# Patient Record
Sex: Female | Born: 1990 | Race: Black or African American | Hispanic: No | Marital: Single | Smoking: Never smoker
Health system: Southern US, Community
[De-identification: ages and names within clinical notes are randomized; demographics above are authoritative.]

## PROBLEM LIST (undated history)

## (undated) ENCOUNTER — Inpatient Hospital Stay (HOSPITAL_COMMUNITY): Payer: Self-pay

## (undated) DIAGNOSIS — D649 Anemia, unspecified: Secondary | ICD-10-CM

## (undated) DIAGNOSIS — K649 Unspecified hemorrhoids: Secondary | ICD-10-CM

## (undated) DIAGNOSIS — R51 Headache: Secondary | ICD-10-CM

## (undated) HISTORY — PX: INDUCED ABORTION: SHX677

---
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
## 2009-01-22 ENCOUNTER — Ambulatory Visit: Payer: Self-pay | Admitting: Family Medicine

## 2009-06-21 ENCOUNTER — Ambulatory Visit: Payer: Self-pay | Admitting: Family Medicine

## 2009-06-21 DIAGNOSIS — R209 Unspecified disturbances of skin sensation: Secondary | ICD-10-CM | POA: Insufficient documentation

## 2009-12-31 ENCOUNTER — Ambulatory Visit: Payer: Self-pay

## 2010-01-12 NOTE — L&D Delivery Note (Signed)
**Note Abigail-Identified via Obfuscation** Delivery Note At 11:39 PM a viable female was delivered via Vaginal, Spontaneous Delivery (Presentation: ROA ).  APGAR: 9,9 ; weight 6lb9oz  Placenta status: delivered, intact, 3VC.  Anesthesia: Epidural  Lacerations: none Est. Blood Loss (mL): < 250 cc  Mom to postpartum.  Baby to nursery-stable.  Abigail Phelps,Abigail Phelps 10/20/2010, 11:59 PM  The patient was seen with Dr Tye Savoy.  I agree with the above. Abigail Phelps 10/21/2010 1:58 AM

## 2010-01-21 ENCOUNTER — Ambulatory Visit: Admit: 2010-01-21 | Payer: Self-pay

## 2010-02-11 NOTE — Assessment & Plan Note (Signed)
Summary: labial pain, depo shot,df   Vital Signs:  Patient profile:   20 year old female Weight:      139.9 pounds Temp:     98.6 degrees F oral Pulse rate:   83 / minute Pulse rhythm:   regular BP sitting:   108 / 74  (left arm) Cuff size:   regular  Vitals Entered By: Loralee Pacas CMA (June 21, 2009 2:02 PM) CC: abdominal pain   Primary Care Provider:  Delbert Harness MD  CC:  abdominal pain.  History of Present Illness: 20 year old here for vaginal discomfort  Noticed about a month ago having some vaginal discomfort in vagina where she thinks her stitches from delivery were.  Did not have pain postpartum- delivered in almost 6 months ago.  No dyspareunia,  but certain undergarments rubs it.  No vaginal bleeding or discharge.  Only bothers her when she wears  boy shorts.  contraception: Wants to stay with depo provera.  Current Medications (verified): 1)  Prenatal Ad  Tabs (Prenatal Vit-Dss-Fe Cbn-Fa) .Marland Kitchen.. 1 Tab By Mouth Daily. 2)  Iron 325 (65 Fe) Mg Tabs (Ferrous Sulfate) .Marland Kitchen.. 1 By Mouth Two Times A Day  Allergies: No Known Drug Allergies PMH-FH-SH reviewed-no changes except otherwise noted  Review of Systems GU:  Denies abnormal vaginal bleeding, discharge, dysuria, and genital sores.  Physical Exam  General:  NAD, Vital signs noted.  Here with friend, cheerful appearing, bonding well with baby.    Genitalia:  normal external genitalia without lesion.  Patient points to area in fold of right labia minora as painful.  thin line of hypopigmentatin possibly an old well healed laceration 2 mm in length.     Impression & Recommendations:  Problem # 1:  DISTURBANCE OF SKIN SENSATION (ICD-782.0)  discomfort of labia not explained by physical exam.  Not likely due to delivery as pain started months after delivery.  No hx of HSV or evidence.  Given no dyspareunia and not distressing to patient will monitor and reasses if continues to bother.  Orders: [facility]- Est Level  3  ([address]109)  Problem # 2:  CONTRACEPTIVE MANAGEMENT (ICD-V25.09) desires to continue with depo provera.  U preg neg, shot given today. Orders: U Preg-[facility] (81025) [facility]- Est Level  3 (60454)  Complete Medication List: 1)  Prenatal Ad Tabs (Prenatal vit-dss-fe cbn-fa) .Marland Kitchen.. 1 tab by mouth daily.  Patient Instructions: 1)  Get depo shot every 3 months! 2)  Follow up in 1 year from postpartum for annual gynecological exam. (January 2012)  Laboratory Results   Urine Tests  Date/Time Received: June 21, 2009 2:13 PM  Date/Time Reported: June 21, 2009 2:21 PM     Urine HCG: negative Comments: ...............test performed by......Marland KitchenBonnie A. [place], MLS (ASCP)cm

## 2010-02-11 NOTE — Assessment & Plan Note (Signed)
Summary: postpartum ck,tcb   Vital Signs:  Patient profile:   20 year old female Weight:      133.5 pounds Temp:     98.3 degrees F oral Pulse rate:   60 / minute Pulse rhythm:   regular BP sitting:   104 / 67  (right arm) Cuff size:   regular  Vitals Entered By: Loralee Pacas CMA (January 22, 2009 2:24 PM) CC: post partum check   Primary Care Provider:  Delbert Harness MD  CC:  post partum check.  History of Present Illness: 20 yo 4 weeks postpartum from SVD on Dec 6.  No complications.  Baby went home with mom. Continued period- menstrual-like.  No pain.  Here today with mom. adjusting well, bonding with baby.  Depo for contraception.  Both bottle and breastfeeding.  No problems with breast tenderness.  Taking multivitamins.  No intercourse yet.  Allergies: No Known Drug Allergies  Review of Systems      See HPI General:  Denies fever. GU:  Denies abnormal vaginal bleeding, discharge, dysuria, and incontinence. Psych:  Denies anxiety and depression.  Physical Exam  General:  NAD, Vital signs noted.  Here with mom, cheerful appearing, bonding well.  Genitalia:  Pelvic Exam:        External: normal female genitalia without lesions or masses        Vagina: normal well healed with no stitches left.        Cervix: normal without tears        Adnexa: normal bimanual exam without masses or fullness        Uterus: normal by palpation    Impression & Recommendations:  Problem # 1:  POSTPARTUM EXAMINATION, NORMAL (ICD-V24.2)  No signs of postpartum depression.  Decerasing lochia, nromal exam with no evidence of endometritis, vaginal or cervical laceration.  Depo for contraception.  Follow-up as needed.  [facility]  Complete Medication List: 1)  Prenatal Ad Tabs (Prenatal vit-dss-fe cbn-fa) .Marland Kitchen.. 1 tab by mouth daily. 2)  Iron 325 (65 Fe) Mg Tabs (Ferrous sulfate) .Marland Kitchen.. 1 by mouth two times a day

## 2010-02-24 ENCOUNTER — Encounter: Payer: Self-pay | Admitting: *Deleted

## 2010-03-19 ENCOUNTER — Ambulatory Visit (INDEPENDENT_AMBULATORY_CARE_PROVIDER_SITE_OTHER): Payer: Self-pay | Admitting: Family Medicine

## 2010-03-19 ENCOUNTER — Encounter: Payer: Self-pay | Admitting: Family Medicine

## 2010-03-19 VITALS — BP 98/76 | HR 88 | Temp 98.6°F | Ht 65.0 in | Wt 142.0 lb

## 2010-03-19 DIAGNOSIS — Z3201 Encounter for pregnancy test, result positive: Secondary | ICD-10-CM | POA: Insufficient documentation

## 2010-03-19 DIAGNOSIS — Z3480 Encounter for supervision of other normal pregnancy, unspecified trimester: Secondary | ICD-10-CM

## 2010-03-19 DIAGNOSIS — N912 Amenorrhea, unspecified: Secondary | ICD-10-CM | POA: Insufficient documentation

## 2010-03-19 LAB — POCT URINE PREGNANCY: Preg Test, Ur: POSITIVE

## 2010-03-19 MED ORDER — PRENATAL AD PO TABS: 1.0000 | ORAL_TABLET | Freq: Every day | ORAL | Status: DC

## 2010-03-19 NOTE — Progress Notes (Signed)
  Subjective:    Patient ID: Abigail Phelps, female    DOB: 08-18-1990, 20 y.o.   MRN: [id number]  HPI  LMP Norberta Keens 2011.  Last depo shot was June 2011.  Took a pregnancy test and it was positive.  Has been feeling nauseous, breast tenderness, no irritability, emesis, weight gain or edema.  Was unplanned but happy to have another child with the father of her other child.    Review of Systemssee hpi     Objective:   Physical Exam  Constitutional: She appears well-developed and well-nourished. No distress.  Abdominal: Soft. Bowel sounds are normal. She exhibits no distension.       Small abdomen, consistent with 1st trimester pregnancy          Assessment & Plan:

## 2010-03-19 NOTE — Patient Instructions (Signed)
Congratulations! Take a prenatal vitamin every day- any one available over the counter is fine.  What is most important is getting 400-800 mg of folic acid daily. Will schedule you for OB ultrasound. We will help you schedule NEW OB appt.

## 2010-03-19 NOTE — Assessment & Plan Note (Signed)
Unsure dates, will get ultrasound for dating.  Will flag to Abigail Phelps when [facility] known to establish pregnancy care here at [facility].  Given prescription for PNV.  Not taking any harmful substances.

## 2010-03-20 ENCOUNTER — Other Ambulatory Visit: Payer: Self-pay | Admitting: Family Medicine

## 2010-03-20 ENCOUNTER — Telehealth: Payer: Self-pay | Admitting: Family Medicine

## 2010-03-20 ENCOUNTER — Ambulatory Visit (HOSPITAL_COMMUNITY)
Admission: RE | Admit: 2010-03-20 | Discharge: 2010-03-20 | Disposition: A | Discharge status: Home / Self Care | Payer: Medicaid Other | Source: Ambulatory Visit | Attending: Family Medicine | Admitting: Family Medicine

## 2010-03-20 DIAGNOSIS — Z3689 Encounter for other specified antenatal screening: Secondary | ICD-10-CM | POA: Insufficient documentation

## 2010-03-20 DIAGNOSIS — Z3480 Encounter for supervision of other normal pregnancy, unspecified trimester: Secondary | ICD-10-CM

## 2010-03-20 DIAGNOSIS — Z3201 Encounter for pregnancy test, result positive: Secondary | ICD-10-CM

## 2010-03-20 MED ORDER — PRENATAL PLUS 27-1 MG PO TABS: 1.0000 | ORAL_TABLET | Freq: Every day | ORAL | Status: DC

## 2010-03-20 NOTE — Telephone Encounter (Signed)
[facility] does not carry rx prescribed for prenatal vitamins, they only carry prenatal plus, can they switch?

## 2010-03-20 NOTE — Telephone Encounter (Signed)
i have e-prescribed it

## 2010-04-02 ENCOUNTER — Other Ambulatory Visit: Payer: Self-pay

## 2010-04-02 ENCOUNTER — Encounter: Payer: Self-pay | Admitting: Family Medicine

## 2010-04-02 DIAGNOSIS — Z3480 Encounter for supervision of other normal pregnancy, unspecified trimester: Secondary | ICD-10-CM

## 2010-04-02 DIAGNOSIS — R8271 Bacteriuria: Secondary | ICD-10-CM

## 2010-04-02 NOTE — Progress Notes (Signed)
Drew pt for New OB labs. Prenatal Panel, HIV, Sickle Cell, and OB urine culture. 1sst, 1red, 1lavender, 1pink, and 1urine. AC 

## 2010-04-03 ENCOUNTER — Emergency Department (HOSPITAL_COMMUNITY)
Admission: EM | Admit: 2010-04-03 | Discharge: 2010-04-04 | Disposition: A | Discharge status: Home / Self Care | Payer: Medicaid Other | Attending: Emergency Medicine | Admitting: Emergency Medicine

## 2010-04-03 DIAGNOSIS — R112 Nausea with vomiting, unspecified: Secondary | ICD-10-CM | POA: Insufficient documentation

## 2010-04-03 DIAGNOSIS — R197 Diarrhea, unspecified: Secondary | ICD-10-CM | POA: Insufficient documentation

## 2010-04-03 DIAGNOSIS — O9989 Other specified diseases and conditions complicating pregnancy, childbirth and the puerperium: Secondary | ICD-10-CM | POA: Insufficient documentation

## 2010-04-03 LAB — URINALYSIS, ROUTINE W REFLEX MICROSCOPIC
Glucose, UA: NEGATIVE mg/dL
Hgb urine dipstick: NEGATIVE
Ketones, ur: >80 mg/dL — AB
pH: 6 (ref 5.0–8.0)

## 2010-04-03 LAB — URINE MICROSCOPIC-ADD ON

## 2010-04-04 LAB — POCT I-STAT, CHEM 8
Calcium, Ion: 1.21 mmol/L (ref 1.12–1.32)
Chloride: 104 mEq/L (ref 96–112)
HCT: 38 % (ref 36.0–46.0)
Sodium: 135 mEq/L (ref 135–145)

## 2010-04-04 LAB — OBSTETRIC PANEL
Basophils Absolute: 0 10*3/uL (ref 0.0–0.1)
Basophils Relative: 0 % (ref 0–1)
Eosinophils Absolute: 0 10*3/uL (ref 0.0–0.7)
HCT: 33.5 % — ABNORMAL LOW (ref 36.0–46.0)
Lymphs Abs: 1.2 10*3/uL (ref 0.7–4.0)
MCH: 23.1 pg — ABNORMAL LOW (ref 26.0–34.0)
Monocytes Relative: 6 % (ref 3–12)
Neutro Abs: 3.1 10*3/uL (ref 1.7–7.7)
RDW: 16.3 % — ABNORMAL HIGH (ref 11.5–15.5)
Rubella: 130.1 IU/mL — ABNORMAL HIGH
WBC: 4.6 10*3/uL (ref 4.0–10.5)

## 2010-04-04 MED ORDER — NITROFURANTOIN MONOHYD MACRO 100 MG PO CAPS: 100.0000 mg | ORAL_CAPSULE | Freq: Two times a day (BID) | ORAL | Status: AC

## 2010-04-04 NOTE — Progress Notes (Signed)
Addended by: Delbert Harness on: 04/04/2010 04:17 PM   Modules accepted: Orders

## 2010-04-04 NOTE — Progress Notes (Signed)
Discussed with patient on phone treating asymptomatic bactiuria.  She is having nausea/vomtingof pregnancy, has some zofran from ER.  Advised may follow-up in office next week if continuing to have problems.  Has NEW OB appts scgeduled Mar 3rd.

## 2010-04-06 LAB — URINE CULTURE: Colony Count: >=100000

## 2010-04-15 ENCOUNTER — Other Ambulatory Visit (HOSPITAL_COMMUNITY)
Admission: RE | Admit: 2010-04-15 | Discharge: 2010-04-15 | Disposition: A | Discharge status: Home / Self Care | Payer: Self-pay | Source: Ambulatory Visit | Attending: Family Medicine | Admitting: Family Medicine

## 2010-04-15 ENCOUNTER — Ambulatory Visit (INDEPENDENT_AMBULATORY_CARE_PROVIDER_SITE_OTHER): Payer: Self-pay | Admitting: Family Medicine

## 2010-04-15 ENCOUNTER — Encounter: Payer: Self-pay | Admitting: Family Medicine

## 2010-04-15 VITALS — BP 105/77 | Temp 98.0°F | Wt 133.0 lb

## 2010-04-15 DIAGNOSIS — Z331 Pregnant state, incidental: Secondary | ICD-10-CM

## 2010-04-15 DIAGNOSIS — Z01419 Encounter for gynecological examination (general) (routine) without abnormal findings: Secondary | ICD-10-CM | POA: Insufficient documentation

## 2010-04-15 DIAGNOSIS — Z3201 Encounter for pregnancy test, result positive: Secondary | ICD-10-CM

## 2010-04-15 LAB — CBC
HCT: 25.5 % — ABNORMAL LOW (ref 36.0–46.0)
HCT: 37.9 % (ref 36.0–46.0)
Hemoglobin: 8.1 g/dL — ABNORMAL LOW (ref 12.0–15.0)
MCHC: 31.8 g/dL (ref 30.0–36.0)
MCHC: 32.2 g/dL (ref 30.0–36.0)
MCV: 79.2 fL (ref 78.0–100.0)
Platelets: 233 10*3/uL (ref 150–400)
RBC: 4.79 MIL/uL (ref 3.87–5.11)
RDW: 15.2 % (ref 11.5–15.5)
WBC: 9.3 10*3/uL (ref 4.0–10.5)

## 2010-04-15 MED ORDER — CEPHALEXIN 500 MG PO CAPS: 500.0000 mg | ORAL_CAPSULE | Freq: Two times a day (BID) | ORAL | Status: AC

## 2010-04-15 NOTE — Patient Instructions (Addendum)
It was great to meet you today.  Congratulations! For nausea/vomiting during your first trimester, please pick up OTC Vitamin B6 25mg .  Take as directed. For your UTI, please take Keflex (antibiotic) as directed. We will call you with a date and time of your integrated screen. Please schedule a follow-up appointment with me in 1 month. Please call MD if you have any questions or concerns.  If you experience vaginal bleeding or excessive discharge, please go to [facility] MAU. Thanks, Dr. Sherron Flemings Sondra Come

## 2010-04-22 ENCOUNTER — Encounter: Payer: Self-pay | Admitting: Family Medicine

## 2010-04-22 DIAGNOSIS — Z3490 Encounter for supervision of normal pregnancy, unspecified, unspecified trimester: Secondary | ICD-10-CM | POA: Insufficient documentation

## 2010-04-22 NOTE — Assessment & Plan Note (Signed)
[id number] here a 12w 1/7d here for initial OB visit. Has experienced nausea and vomiting in first trimester, but cannot afford Zofran.  Still waiting for Medicaid approval. Recommended OTC Vit B6 to prevent N/V.  Denies other first trimester complications. Will refer patient to [facility] for integrated screen.  PTL precautions discussed.  Will treat UTI with Keflex.  No early glucola indicated, taking PNV. F/u in 4 weeks. PTL precautions discussed.

## 2010-05-15 ENCOUNTER — Ambulatory Visit (INDEPENDENT_AMBULATORY_CARE_PROVIDER_SITE_OTHER): Payer: Medicaid Other | Admitting: Family Medicine

## 2010-05-15 DIAGNOSIS — Z3480 Encounter for supervision of other normal pregnancy, unspecified trimester: Secondary | ICD-10-CM

## 2010-05-15 NOTE — Patient Instructions (Signed)
It was great to see you today. I will order an anatomy U/S and integrated screen. We will call you with a time and date.  If you don't hear from [place] in 2 weeks, please call the office. You and baby are doing great.  Keep up the good work. Please schedule a follow up appointment at the [facility] clinic with Dr. [place] or Dr. Mauricio Po in 1 month. Thanks!

## 2010-05-20 ENCOUNTER — Telehealth: Payer: Self-pay | Admitting: Family Medicine

## 2010-05-20 NOTE — Telephone Encounter (Signed)
Never got rx for iron pills [facility] [address]

## 2010-05-20 NOTE — Telephone Encounter (Signed)
Hi Abigail Phelps, she is already taking a prenatal vitamin with Fe so she does not need to anything else.  Thanks.

## 2010-05-21 NOTE — Telephone Encounter (Signed)
Spoke with patient and informed about the below

## 2010-05-22 ENCOUNTER — Encounter: Payer: Self-pay | Admitting: Family Medicine

## 2010-05-22 NOTE — Assessment & Plan Note (Addendum)
[id number] here @ 16w 3/7d here for routine prenatal OB visit.   Denies any complaints of N/V that she experienced in first trimester.   Treated UTI w/ Keflex at last visit.  Will need follow up TOC at later visit.  No early glucola indicated, no 1st degree relative with DM or hx of GDM in previous pregnancies.   Will need to ask patient what year she had the miscarriage.   Taking PNV.  PTL precautions discussed. F/u in 4 weeks.

## 2010-05-30 ENCOUNTER — Telehealth: Payer: Self-pay | Admitting: Family Medicine

## 2010-05-30 NOTE — Telephone Encounter (Signed)
Pt checking status of appt for u/s

## 2010-06-02 ENCOUNTER — Other Ambulatory Visit: Payer: Self-pay | Admitting: Family Medicine

## 2010-06-02 DIAGNOSIS — Z3689 Encounter for other specified antenatal screening: Secondary | ICD-10-CM

## 2010-06-02 NOTE — Telephone Encounter (Signed)
Pt has an appt to have US done on 05.23.2012 @ 3 pm pt to arrive @ 240 pm. Pt informed and agreed.Abigail Phelps

## 2010-06-04 ENCOUNTER — Ambulatory Visit (HOSPITAL_COMMUNITY)
Admission: RE | Admit: 2010-06-04 | Discharge: 2010-06-04 | Disposition: A | Discharge status: Home / Self Care | Payer: Medicaid Other | Source: Ambulatory Visit | Attending: Family Medicine | Admitting: Family Medicine

## 2010-06-04 DIAGNOSIS — Z3689 Encounter for other specified antenatal screening: Secondary | ICD-10-CM | POA: Insufficient documentation

## 2010-06-04 LAB — US OB COMP + 14 WK

## 2010-06-19 ENCOUNTER — Ambulatory Visit (INDEPENDENT_AMBULATORY_CARE_PROVIDER_SITE_OTHER): Payer: Self-pay | Admitting: Family Medicine

## 2010-06-19 ENCOUNTER — Encounter: Payer: Self-pay | Admitting: Family Medicine

## 2010-06-19 DIAGNOSIS — Z3480 Encounter for supervision of other normal pregnancy, unspecified trimester: Secondary | ICD-10-CM

## 2010-06-19 NOTE — Progress Notes (Signed)
Pt seen with Dr. Lula Olszewski today. Pt too late for a quad screen.   Advised supportive care for the ganglion cyst. Urine cx today

## 2010-06-19 NOTE — Patient Instructions (Signed)
It was nice to meet you.  Please make an appointment to be seen again in 4 weeks.  If you have any problems with contractions, vaginal bleeding, decreased movement of your baby, or you think your water has broken, please go to [facility] to be evaluated.  If you are interested in birthing classes, parenting classes, or breastfeeding classes, please let Dr. Tye Savoy know or you can call [facility].

## 2010-06-19 NOTE — Progress Notes (Signed)
   Abigail Phelps is a 20 y.o. female being seen today for her obstetrical visit. She is at [redacted]w[redacted]d gestation. Patient reports pain in her right wrist, especially with extension.  She works at [employer] and it bothers her a lot there.  She also has a bump on her wrist where it hurts the worst. . Fetal movement: normal.  Wrist pain- patient likely has ganglion cyst on 3rd right extensor tendon.  Advise Tylenol and icing the wrist.  Quad screen [place] reviewed,no abnormalities seen.  Will do Urine Cx today for test of cure.  Follow up in 4 weeks.

## 2010-06-21 LAB — URINE CULTURE: Colony Count: NO GROWTH

## 2010-07-14 ENCOUNTER — Ambulatory Visit (INDEPENDENT_AMBULATORY_CARE_PROVIDER_SITE_OTHER): Payer: Medicaid Other | Admitting: Family Medicine

## 2010-07-14 VITALS — BP 101/62 | Temp 98.6°F | Wt 145.0 lb

## 2010-07-14 DIAGNOSIS — Z3480 Encounter for supervision of other normal pregnancy, unspecified trimester: Secondary | ICD-10-CM

## 2010-07-14 DIAGNOSIS — Z3490 Encounter for supervision of normal pregnancy, unspecified, unspecified trimester: Secondary | ICD-10-CM

## 2010-07-14 DIAGNOSIS — Z331 Pregnant state, incidental: Secondary | ICD-10-CM

## 2010-07-14 NOTE — Progress Notes (Signed)
[redacted]w[redacted]d gestation.  No HA, SOB, N/V, vaginal bleeding.  Good fetal activity. Concerned about umbilical hernia that is becoming more prominent as her belly grows. Will follow for now.  This should not interfere with pregnancy.  No pain or tenderness on PE. Will do a 1 hour GTT today. Will order CBC/RPR/HIV. Patient to fill out PMH form. Return to clinic in 4 weeks.  Will send hollister to MAU at that time via Epic.

## 2010-07-14 NOTE — Assessment & Plan Note (Signed)
[id number] here @ 24w 5/7d here for routine prenatal OB visit.   Concerned about umbilical hernia.  Reassured patient.  Will monitor. Treated UTI w/ Keflex during first trimester.  TOC urine culture: no growth. 1 hr GTT was 82.  Taking PNV.  PTL precautions discussed. F/u in 4 weeks.

## 2010-07-14 NOTE — Patient Instructions (Signed)
It was great to see you today. You and your baby are doing well. Keep up the good work! Continue to take prenatal vitamin daily. Practice kick counts - baby should move/kick 10 times in an hour.  -- if you feel like baby is not moving well, please to go MAU.  -- if you experience vaginal bleeding, please go to MAU. Please return to clinic in 4 weeks for follow up. Thanks!

## 2010-07-15 LAB — CBC
HCT: 29 % — ABNORMAL LOW (ref 36.0–46.0)
MCH: 23 pg — ABNORMAL LOW (ref 26.0–34.0)
MCHC: 31.7 g/dL (ref 30.0–36.0)
MCV: 72.5 fL — ABNORMAL LOW (ref 78.0–100.0)
RDW: 14.1 % (ref 11.5–15.5)

## 2010-08-08 ENCOUNTER — Telehealth: Payer: Self-pay | Admitting: Family Medicine

## 2010-08-08 NOTE — Telephone Encounter (Signed)
Pt is needing to talk to nurse about what she can use for hemorrhoids.

## 2010-08-08 NOTE — Telephone Encounter (Signed)
States hemorrhoid was swollen and painful. She has been using Tucks and swelling has gone down. Now just having itching and irritation and little bleeding. Consulted with Dr. Mauricio Po.  Advises Sitz bath with Epsom Salt, Preparation H  may also continue using Tucks.

## 2010-08-12 ENCOUNTER — Ambulatory Visit: Payer: Medicaid Other | Admitting: Family Medicine

## 2010-08-19 ENCOUNTER — Ambulatory Visit (INDEPENDENT_AMBULATORY_CARE_PROVIDER_SITE_OTHER): Payer: Medicaid Other | Admitting: Family Medicine

## 2010-08-19 DIAGNOSIS — Z3480 Encounter for supervision of other normal pregnancy, unspecified trimester: Secondary | ICD-10-CM

## 2010-08-19 NOTE — Progress Notes (Signed)
29.6 weeks today.  Patient has gained 13 lbs since pre-pregnancy weight. Fundus measured 26 cm today.  FHR reassuring. Will send patient to [facility] for OB ultrasound re: question IUGR. Repeat 1 hr GTT was 120. Reviewed kick counts and PTL precautions. Plans to breast and bottle feed.  Contraception: undecided Follow up in 4 weeks.  Follow up ultrasound.

## 2010-08-19 NOTE — Patient Instructions (Signed)
Please go to [facility] for a repeat OB ultrasound to makes sure baby is growing appropriately. You may take OTC Vitamin B6 for nausea. Please schedule follow up appointment with me in 4 weeks. Thank you!

## 2010-08-21 ENCOUNTER — Other Ambulatory Visit: Payer: Self-pay | Admitting: Family Medicine

## 2010-08-21 ENCOUNTER — Ambulatory Visit (HOSPITAL_COMMUNITY)
Admission: RE | Admit: 2010-08-21 | Discharge: 2010-08-21 | Disposition: A | Discharge status: Home / Self Care | Payer: Medicaid Other | Source: Ambulatory Visit | Attending: Family Medicine | Admitting: Family Medicine

## 2010-08-21 DIAGNOSIS — O365990 Maternal care for other known or suspected poor fetal growth, unspecified trimester, not applicable or unspecified: Secondary | ICD-10-CM | POA: Insufficient documentation

## 2010-08-21 DIAGNOSIS — Z3689 Encounter for other specified antenatal screening: Secondary | ICD-10-CM | POA: Insufficient documentation

## 2010-09-03 ENCOUNTER — Ambulatory Visit (INDEPENDENT_AMBULATORY_CARE_PROVIDER_SITE_OTHER): Payer: Medicaid Other | Admitting: Family Medicine

## 2010-09-03 DIAGNOSIS — Z3480 Encounter for supervision of other normal pregnancy, unspecified trimester: Secondary | ICD-10-CM

## 2010-09-03 NOTE — Progress Notes (Signed)
32 weeks today.  Has gained 3 lbs in 4 weeks. Reviewed ultrasound with patient - normal.  No evidence of IUGR. Fundus measured 30cm.  Reassuring FHR. Reviewed kick counts and PTL precautions.  Plans to breast and bottle feed. Contraception: Depo Follow up in 2 weeks.

## 2010-09-03 NOTE — Patient Instructions (Signed)
Please schedule follow-up appointment in 2 weeks

## 2010-09-18 ENCOUNTER — Ambulatory Visit (INDEPENDENT_AMBULATORY_CARE_PROVIDER_SITE_OTHER): Payer: Medicaid Other | Admitting: Family Medicine

## 2010-09-18 DIAGNOSIS — Z3480 Encounter for supervision of other normal pregnancy, unspecified trimester: Secondary | ICD-10-CM

## 2010-09-18 NOTE — Patient Instructions (Signed)
Please return to clinic in 2 weeks. If your contractions become regular, every 5 to 7 minutes, please go to MAU. Count kicks or fetal movement by laying down for an hour and counting each kick - if less than 10 kicks in an hour, go to MAU. If you develop vaginal bleeding or gush of fluid, please to go MAU.

## 2010-09-18 NOTE — Progress Notes (Signed)
[redacted]w[redacted]d today.  Complains of Braxton Hicks contractions; painful, but last for less than a minute. Fundus measured 32cm.  Reassuring FHR. PTL precautions - patient concerned that contractions are too close together.  Advised patient to go to MAU if contractions occur every regularly, every 5 - 7 minutes. Plans to breast and bottle feed. Contraception: Depo Follow up in 2 weeks.

## 2010-10-02 ENCOUNTER — Ambulatory Visit (INDEPENDENT_AMBULATORY_CARE_PROVIDER_SITE_OTHER): Payer: Medicaid Other | Admitting: Family Medicine

## 2010-10-02 DIAGNOSIS — Z23 Encounter for immunization: Secondary | ICD-10-CM

## 2010-10-02 DIAGNOSIS — Z331 Pregnant state, incidental: Secondary | ICD-10-CM

## 2010-10-02 NOTE — Patient Instructions (Signed)
I will review your lab results at your next visit. Please schedule follow up appointment next week. Normal Labor and Delivery (First Baby) Your caregiver must first be sure you are in labor. Signs of labor include:  You may pass what is called "the mucus plug" before labor begins. This is a small amount of blood stained mucus.   Regular uterine contractions.   The time between contractions get closer together.   The discomfort and pain gradually gets more intense.   Pains are mostly located in the back.   Pains get worse when walking.   The cervix (the opening of the uterus becomes thinner (begins to efface) and opens up (dilates).  Once you are in labor and admitted into the hospital or care center, your caregiver will do the following:  A complete physical examination.   Check your vital signs (blood pressure, pulse, temperature and the fetal heart rate).   Do a vaginal examination (using a sterile glove and lubricant) to determine:   The position (presentation) of the baby (head [vertex] or buttock first).   The level (station) of the baby's head in the birth canal.   The effacement and dilatation of the cervix.   You may have your pubic hair shaved and be given an enema depending on your caregiver and the circumstance.   An electronic monitor is usually placed on your abdomen. The monitor follows the length and intensity of the contractions, as well as the baby's heart rate.   Usually, your caregiver will insert an IV in your arm with a bottle of sugar water. This is done as a precaution so that medications can be given to you quickly during labor or delivery.  NORMAL LABOR AND DELIVERY IS DIVIDED UP INTO THREE STAGES: First Stage This is when regular contractions begin and the cervix begins to efface and dilate. This stage can last from 3 to 15 hours. The end of the first stage is when the cervix is 100% effaced and 10 centimeters dilated. Pain medications may be given by    Injection (morphine, demerol, etc.)   Regional anesthesia (spinal, caudal or epidural, anesthetics given in different locations of the spine). Paracervical pain medication may be given, which is an injection of and anesthetic (novocaine or xylocaine) on each side of the cervix.  A pregnant woman may request to have "Natural Childbirth" which is not to have any medications or anesthesia during her labor and delivery. Second Stage This is when the baby comes down through the birth canal (vagina) and is born. This can take 1 to 4 hours. As the baby's head comes down through the birth canal, you may feel like you are going to have a bowel movement. You will get the urge to bear down and push until the baby is delivered. As the baby's head is being delivered, the caregiver will decide if an episiotomy (a cut in the perineum and vagina area) is needed to prevent tearing of the tissue in this area. The episiotomy is sewn up after the delivery of the baby and placenta. Sometimes a mask with nitrous oxide is given for the mother to breath during the delivery of the baby to help if there is too much pain. The end of Stage 2 is when the baby is fully delivered. Then when the umbilical cord stops pulsating it is clamped and cut. Third Stage The third stage begins after the baby is completely delivered and ends after the placenta (afterbirth) is delivered. This usually  takes 5 to 30 minutes. After the placenta is delivered, a medication is given either by intravenous or injection to help contract the uterus and prevent bleeding. The third stage is not painful and pain medication is usually not necessary. If an episiotomy was done, it is repaired at this time. After the delivery, the mother is watched and monitored closely for 1 to 2 hours to make sure there is no postpartum bleeding (hemorrhage). If there is a lot of bleeding, medication is given to contract the uterus and stop the bleeding. Document Released:  10/08/2007 Document Re-Released: 01/20/2009 [facility] Patient Information 2011 [place], [place].

## 2010-10-02 NOTE — Progress Notes (Signed)
36.1 weeks today. Irregular contractions - braxton hicks, but more painful than before. Will obtain GC/Chlamydia/GBS cultures today. Birth control: Depo shot Plans to Breast and bottle feed. Labor precautions reviewed. Follow up visit in one week.

## 2010-10-07 ENCOUNTER — Ambulatory Visit (INDEPENDENT_AMBULATORY_CARE_PROVIDER_SITE_OTHER): Payer: Medicaid Other | Admitting: Family Medicine

## 2010-10-07 DIAGNOSIS — Z331 Pregnant state, incidental: Secondary | ICD-10-CM

## 2010-10-07 NOTE — Patient Instructions (Signed)
Labor precautions and kick counts reviewed. Return to clinic in one week.

## 2010-10-07 NOTE — Progress Notes (Signed)
36.6 weeks - no complaints; irregular, painful contractions. GBS negative; Gonorrhea/Chlamydia negative. Birth control: Depo Plans to breast and bottle feed. Labor precautions reviewed. Return to clinic in one week.

## 2010-10-14 ENCOUNTER — Ambulatory Visit (INDEPENDENT_AMBULATORY_CARE_PROVIDER_SITE_OTHER): Payer: Medicaid Other | Admitting: Family Medicine

## 2010-10-14 DIAGNOSIS — Z331 Pregnant state, incidental: Secondary | ICD-10-CM

## 2010-10-14 LAB — POCT FERN TEST: Fern Test: NEGATIVE

## 2010-10-15 LAB — SAMPLE TO BLOOD BANK

## 2010-10-15 LAB — CBC
HCT: 30.9 — ABNORMAL LOW
HCT: 31.6 — ABNORMAL LOW
Hemoglobin: 10.3 — ABNORMAL LOW
Platelets: 237
RBC: 4.15
RDW: 14.8
RDW: 14.9
WBC: 9.2

## 2010-10-15 LAB — WET PREP, GENITAL
Clue Cells Wet Prep HPF POC: NONE SEEN
Trich, Wet Prep: NONE SEEN
Yeast Wet Prep HPF POC: NONE SEEN

## 2010-10-15 LAB — ABO/RH: ABO/RH(D): O POS

## 2010-10-15 LAB — GC/CHLAMYDIA PROBE AMP, GENITAL: Chlamydia, DNA Probe: NEGATIVE

## 2010-10-15 NOTE — Patient Instructions (Signed)
Labor precautions reviewed. Please schedule follow up appointment in 1 week.

## 2010-10-15 NOTE — Progress Notes (Signed)
37.6 weeks - complains of increase in vaginal discharge, thick mucous.   Also c/o intermittent pelvic and low back pain. Will do fern and nitrazine test today to evaluate for ROM. Reassured patient that pelvic pain is common at this time, but if becomes unbearable >> MAU. GBS negative. Birth control: Depo Plans to breast and bottle feed. If water breaks, bleeding, or regular ctx >> MAU. Otherwise, return to clinic in one week.

## 2010-10-17 ENCOUNTER — Observation Stay (HOSPITAL_COMMUNITY)
Admission: AD | Admit: 2010-10-17 | Discharge: 2010-10-18 | DRG: 780 | Disposition: A | Discharge status: Home / Self Care | Payer: Medicaid Other | Source: Ambulatory Visit | Attending: Obstetrics and Gynecology | Admitting: Obstetrics and Gynecology

## 2010-10-17 ENCOUNTER — Encounter (HOSPITAL_COMMUNITY): Payer: Self-pay | Admitting: *Deleted

## 2010-10-17 DIAGNOSIS — O479 False labor, unspecified: Principal | ICD-10-CM | POA: Diagnosis present

## 2010-10-17 DIAGNOSIS — O471 False labor at or after 37 completed weeks of gestation: Secondary | ICD-10-CM | POA: Diagnosis present

## 2010-10-17 DIAGNOSIS — M79609 Pain in unspecified limb: Secondary | ICD-10-CM | POA: Diagnosis present

## 2010-10-17 DIAGNOSIS — IMO0001 Reserved for inherently not codable concepts without codable children: Secondary | ICD-10-CM

## 2010-10-17 DIAGNOSIS — R109 Unspecified abdominal pain: Secondary | ICD-10-CM | POA: Diagnosis present

## 2010-10-17 HISTORY — DX: Headache: R51

## 2010-10-17 LAB — CBC
HCT: 27.1 % — ABNORMAL LOW (ref 36.0–46.0)
Hemoglobin: 8.7 g/dL — ABNORMAL LOW (ref 12.0–15.0)
RBC: 3.82 MIL/uL — ABNORMAL LOW (ref 3.87–5.11)

## 2010-10-17 LAB — URINE MICROSCOPIC-ADD ON

## 2010-10-17 LAB — URINALYSIS, ROUTINE W REFLEX MICROSCOPIC
Glucose, UA: NEGATIVE mg/dL
Ketones, ur: NEGATIVE mg/dL
Protein, ur: NEGATIVE mg/dL
Urobilinogen, UA: 0.2 mg/dL (ref 0.0–1.0)

## 2010-10-17 LAB — WET PREP, GENITAL

## 2010-10-17 MED ORDER — OXYCODONE-ACETAMINOPHEN 5-325 MG PO TABS
1.0000 | ORAL_TABLET | Freq: Once | ORAL | Status: AC
  Administered 2010-10-17: 1 via ORAL
  Filled 2010-10-17: qty 1

## 2010-10-17 NOTE — ED Provider Notes (Signed)
History    Patient presents with a c/o RT lower quadrant pain that radiates up her abdomen but not to her back and down her RT leg, and in her hip. She states she has had increase in her vaginal discharge lately, but no vaginal bleeding. She is having some contractions, but not a lot. She was checked in the clinic about 2 weeks ago and was barely dilated to 1 cm. She last had intercourse about 2-3 weeks ago. She denies constipation. She feels like she really had to push to urinate, and it is uncomfortable to urinate. No urgency or frequency changes to her urinary patterns. She feels good baby movement and denies vaginal bleeding.  Chief Complaint  Patient presents with  . Abdominal Pain   HPI 20-yo G1, 38.2 wks, receives care at [facility], Dr Vladimir Faster  Past Medical History  Diagnosis Date  . Headache     freq during allergy season    Past Surgical History  Procedure Date  . Induced abortion     No family history on file.  History  Substance Use Topics  . Smoking status: Never Smoker   . Smokeless tobacco: Never Used  . Alcohol Use: No    Allergies: No Known Allergies  Prescriptions prior to admission  Medication Sig Dispense Refill  . acetaminophen (TYLENOL) 325 MG tablet Take 650 mg by mouth as needed. For pain       . prenatal vitamin w/FE, FA (PRENATAL 1 + 1) 27-1 MG TABS Take 1 tablet by mouth daily. Please dispense prenatal Plus MVI on your $4 formulary  30 each  11  . Prenatal Vit-DSS-Fe Cbn-FA (PRENATAL AD) tablet Take 1 tablet by mouth daily.  30 tablet  11    Review of Systems  Constitutional: Negative for fever and chills.  Eyes: Negative for blurred vision.  Respiratory: Negative for cough.   Cardiovascular: Negative for chest pain.  Gastrointestinal: Positive for abdominal pain. Negative for heartburn, nausea, vomiting, diarrhea and constipation.  Genitourinary: Positive for dysuria. Negative for urgency, frequency, hematuria and flank pain.  Musculoskeletal:  Negative for myalgias.       RT leg pain  Skin: Negative for rash.  Neurological: Negative for dizziness and headaches.  Endo/Heme/Allergies: Does not bruise/bleed easily.   Physical Exam   Blood pressure 114/70, pulse 91, temperature 98.7 F (37.1 C), temperature source Oral, resp. rate 20, height 5' 4.25" (1.632 m), weight 69.514 kg (153 lb 4 oz), last menstrual period 09/18/2009.  Physical Exam  Constitutional: She is oriented to person, place, and time. She appears well-developed and well-nourished. She appears distressed.  HENT:  Head: Normocephalic.  Eyes: Pupils are equal, round, and reactive to light.  Neck: Normal range of motion.  Cardiovascular: Normal rate, regular rhythm, normal heart sounds and intact distal pulses.  Exam reveals no gallop and no friction rub.   No murmur heard. Respiratory: Effort normal and breath sounds normal. No respiratory distress. She has no wheezes. She has no rales. She exhibits no tenderness.  GI: Soft. Bowel sounds are normal. She exhibits no distension and no mass. There is tenderness. There is guarding. There is no rebound.       TTP of suprapubic area and RLQ along the outer contour of uterus  Genitourinary: Vaginal discharge found.  Musculoskeletal: Normal range of motion. She exhibits tenderness.       Tenderness of RT hip and with rotational movements and flexion of the RT leg  Neurological: She is alert and oriented  to person, place, and time.  Skin: Skin is warm and dry. No rash noted. She is not diaphoretic.   FHT: 140's/+Accels/no decels/no contractions seen  MAU Course  Procedures  After several hours of monitoring and cervical exams, patient is found to be 5 cm dilated (from 3 initially when first seen) and will be admitted to L&D.  Assessment and Plan  IUP@ 38.3, GBS Neg Admit to L&D PCM Dr Vladimir Faster notified by text page  Abigail Phelps 10/17/2010, 8:30 PM

## 2010-10-17 NOTE — ED Notes (Signed)
Dr Holbrook in to see pt 

## 2010-10-17 NOTE — Progress Notes (Signed)
Pain in rt lower quad- rt leg feels heavy, difficult to move.

## 2010-10-17 NOTE — Progress Notes (Signed)
Dr Natale Milch in and spec exam done. Wet prep and GC/Chlam obtained. Pt tol well

## 2010-10-17 NOTE — ED Notes (Signed)
Pt wants to eat. Oked per Dr Natale Milch.

## 2010-10-17 NOTE — Progress Notes (Signed)
Dr Natale Milch in to see pt. EFm strip reviewed.

## 2010-10-17 NOTE — Progress Notes (Signed)
Pt states, " I've had a constant pain in my right low abdomen for a week. I've tried tylenol but it didn't help. It is worse when I walk, and when I am laying down and sit back up. That makes it hurt real bad."

## 2010-10-17 NOTE — ED Notes (Signed)
Up to BR. Crackers given with pain med

## 2010-10-17 NOTE — Progress Notes (Signed)
Pt states feels fine. Tol Percocet well

## 2010-10-17 NOTE — Progress Notes (Signed)
MD discussing since cervix changed slightly will obs another hr and reck cervix. Pt agrees. To L side

## 2010-10-17 NOTE — Progress Notes (Signed)
Dr Natale Milch notified of pt's admission and status.U/A results reviewed. Will see pt

## 2010-10-17 NOTE — ED Notes (Signed)
Baby very active, monitor adjusted.

## 2010-10-17 NOTE — Progress Notes (Signed)
Aware MD tied up in BS

## 2010-10-17 NOTE — Progress Notes (Signed)
Pt's waiting for her mom to bring food

## 2010-10-17 NOTE — Progress Notes (Signed)
Up to BR to void

## 2010-10-18 ENCOUNTER — Encounter (HOSPITAL_COMMUNITY): Payer: Self-pay | Admitting: *Deleted

## 2010-10-18 DIAGNOSIS — O471 False labor at or after 37 completed weeks of gestation: Secondary | ICD-10-CM | POA: Diagnosis present

## 2010-10-18 DIAGNOSIS — O479 False labor, unspecified: Principal | ICD-10-CM

## 2010-10-18 LAB — GC/CHLAMYDIA PROBE AMP, GENITAL: GC Probe Amp, Genital: NEGATIVE

## 2010-10-18 LAB — CBC
HCT: 27.6 % — ABNORMAL LOW (ref 36.0–46.0)
Hemoglobin: 8.8 g/dL — ABNORMAL LOW (ref 12.0–15.0)
MCH: 22.8 pg — ABNORMAL LOW (ref 26.0–34.0)
MCHC: 31.9 g/dL (ref 30.0–36.0)
MCV: 71.5 fL — ABNORMAL LOW (ref 78.0–100.0)

## 2010-10-18 MED ORDER — BUTORPHANOL TARTRATE 2 MG/ML IJ SOLN: 1.0000 mg | INTRAMUSCULAR | Status: DC | PRN

## 2010-10-18 MED ORDER — OXYCODONE-ACETAMINOPHEN 5-325 MG PO TABS: 2.0000 | ORAL_TABLET | ORAL | Status: DC | PRN

## 2010-10-18 MED ORDER — OXYTOCIN BOLUS FROM INFUSION
500.0000 mL | Freq: Once | INTRAVENOUS | Status: DC
  Filled 2010-10-18: qty 500

## 2010-10-18 MED ORDER — FLEET ENEMA 7-19 GM/118ML RE ENEM: 1.0000 | ENEMA | RECTAL | Status: DC | PRN

## 2010-10-18 MED ORDER — CITRIC ACID-SODIUM CITRATE 334-500 MG/5ML PO SOLN: 30.0000 mL | ORAL | Status: DC | PRN

## 2010-10-18 MED ORDER — ONDANSETRON HCL 4 MG/2ML IJ SOLN: 4.0000 mg | Freq: Four times a day (QID) | INTRAMUSCULAR | Status: DC | PRN

## 2010-10-18 MED ORDER — LACTATED RINGERS IV SOLN
INTRAVENOUS | Status: DC
  Administered 2010-10-18: 02:00:00 via INTRAVENOUS

## 2010-10-18 MED ORDER — LIDOCAINE HCL (PF) 1 % IJ SOLN: 30.0000 mL | INTRAMUSCULAR | Status: DC | PRN

## 2010-10-18 MED ORDER — IBUPROFEN 600 MG PO TABS: 600.0000 mg | ORAL_TABLET | Freq: Four times a day (QID) | ORAL | Status: DC | PRN

## 2010-10-18 MED ORDER — OXYTOCIN 20 UNITS IN LACTATED RINGERS INFUSION - SIMPLE: 125.0000 mL/h | Freq: Once | INTRAVENOUS | Status: DC

## 2010-10-18 MED ORDER — LACTATED RINGERS IV SOLN: 500.0000 mL | INTRAVENOUS | Status: DC | PRN

## 2010-10-18 MED ORDER — ACETAMINOPHEN 325 MG PO TABS: 650.0000 mg | ORAL_TABLET | ORAL | Status: DC | PRN

## 2010-10-18 NOTE — Progress Notes (Signed)
Abigail Phelps is a 20 y.o. G3P1011 at [redacted]w[redacted]d by LMP admitted for labor. Patients contractions remain irregular, while patient perceives the discomfort of the contractions as having increased to an 8 of 10.  NO inter-contraction pain at present. No bleeding or srom  Subjective:   Objective: BP 108/61  Pulse 93  Temp(Src) 98.2 F (36.8 C) (Oral)  Resp 18  Ht 5' 4.25" (1.632 m)  Wt 69.514 kg (153 lb 4 oz)  BMI 26.10 kg/m2  LMP 09/18/2009      FHT:  FHR: 140 bpm, variability: moderate,  accelerations:  Present,  decelerations:  Absent UC:   irregular, every 5-10 minutes SVE:   Cx  4/ long, posterior /-3 with minimal lite pink blood on glove    Labs: Lab Results  Component Value Date   WBC 9.7 10/18/2010   HGB 8.8* 10/18/2010   HCT 27.6* 10/18/2010   MCV 71.5* 10/18/2010   PLT 224 10/18/2010    Assessment / Plan: early latent phase vs false labor  Labor: Will allow to walk x 2 hours, if no cervical change by same examiner, will consider discharge home Preeclampsia:   Fetal Wellbeing:  Category I Pain Control:  Labor support without medications I/D:  n/a Anticipated MOD:  NSVD once in adeq labor  Britteney Ayotte V 10/18/2010, 4:52 AM

## 2010-10-18 NOTE — Progress Notes (Signed)
Abigail Phelps is a 20 y.o. [id number] admitted for labor  Subjective:   Objective: BP 123/67  Pulse 86  Temp(Src) 98.2 F (36.8 C) (Oral)  Resp 20  Ht 5' 4.25" (1.632 m)  Wt 69.514 kg (153 lb 4 oz)  BMI 26.10 kg/m2  LMP 09/18/2009      FHT:  FHR: 130s bpm, variability: moderate,  accelerations:  Present,  decelerations:  Absent UC:   q5-7 min SVE:   Dilation: 5.5 Effacement (%): 50 Station: -3 Exam by:: Dr Natale Milch  Labs: Lab Results  Component Value Date   WBC 9.7 10/18/2010   HGB 8.8* 10/18/2010   HCT 27.6* 10/18/2010   MCV 71.5* 10/18/2010   PLT 224 10/18/2010    Assessment / Plan: Spontaneous labor, progressing normally; will recheck cervix at next rounds. Pt not complaining of any additional or increase in pain.  Labor: Progressing normally Fetal Wellbeing:  Category I Pain Control:  Labor support without medications I/D:  n/a Anticipated MOD:  NSVD  Dayshawn Irizarry N 10/18/2010, 3:47 AM

## 2010-10-18 NOTE — Discharge Summary (Signed)
  Discharge summary on Abigail Phelps. Abigail Phelps was admitted late on 10/17/2010 to rule out labor when cervical change was felt to have occurred. She was monitored in labor and delivery for approximately 12 hours during which time labor diminished and that is documented cervical change occurred repeat examinations by the same provider 3 hours apart showed no change in cervix which was noted to be 3-1/2-4 cm posterior  long 20% effaced/-3 station. Fetal heart monitoring was category 1. The patient was told of the lack of progress and recommended to go home patient was given instructions regarding false labor and 1 symptoms to look for 4 return for reassessment she has good transportation and family support and will return when necessary prn decreased fetal movement or for other  Concerns, rupture membranes fever greater than 100.4, etc. followup as scheduled and the family practice center with Dr. Domenick Bookbinder

## 2010-10-18 NOTE — Progress Notes (Signed)
Abigail Phelps is a 20 y.o. G3P1011 at [redacted]w[redacted]d by LMP admitted for early labor/false labor, rt thigh pain and pressure  Subjective: Pt more animated, less perceived pain denies gush of fluid or increase in contraction intensity  Objective: BP 108/58  Pulse 91  Temp(Src) 98.4 F (36.9 C) (Oral)  Resp 18  Ht 5' 4.25" (1.632 m)  Wt 69.514 kg (153 lb 4 oz)  BMI 26.10 kg/m2  LMP 09/18/2009      FHT:  FHR: 140 bpm, variability: moderate,  accelerations:  Present,  decelerations:  Absent UC:   irregular, every 3-6` minutes SVE:   Dilation: 3.5 Effacement (%): 20 Station: -3 Exam by:: [email or web address] (cervix more difficult to reach)  Labs: Lab Results  Component Value Date   WBC 9.7 10/18/2010   HGB 8.8* 10/18/2010   HCT 27.6* 10/18/2010   MCV 71.5* 10/18/2010   PLT 224 10/18/2010    Assessment / Plan: false labor, 38+3 wks  Labor: not in labor Preeclampsia:   Fetal Wellbeing:  Category I Pain Control:  patient declines analgesic I/D:  n/a Anticipated MOD:  nsvd at future date  Daemon Dowty V 10/18/2010, 8:59 AM

## 2010-10-18 NOTE — Progress Notes (Signed)
SVE done by Dr. Emelda Fear.  Provider talks to pt about possibility of going home.  Pt agrees to walk in hall and provider will be back in a few hours to recheck.  Monitors off per MD orders

## 2010-10-18 NOTE — Progress Notes (Signed)
Up to BR but states unable to void

## 2010-10-18 NOTE — Progress Notes (Signed)
Report called to [employer] in [facility]. Pt to BS via w/c

## 2010-10-18 NOTE — H&P (Signed)
Abigail Phelps is a 20 y.o. female presenting for pain. Maternal Medical History:  Reason for admission: Reason for Admission:   nauseaPatient presented for abdominal pain and pain in RT leg. See MAU note dated 10/17/2010.  Fetal activity: Perceived fetal activity is normal.   Last perceived fetal movement was within the past hour.    Prenatal complications: No bleeding, infection, pre-eclampsia or preterm labor.   Prenatal Complications - Diabetes: none.    OB History    Grav Para Term Preterm Abortions TAB SAB Ect Mult Living   3 1 1  0 1     1     Past Medical History  Diagnosis Date  . Headache     freq during allergy season   Past Surgical History  Procedure Date  . Induced abortion    Family History: family history is not on file. Social History:  reports that she has never smoked. She has never used smokeless tobacco. She reports that she does not drink alcohol or use illicit drugs.  Review of Systems  Constitutional: Negative for fever.  Eyes: Negative for blurred vision.  Respiratory: Negative for cough.   Cardiovascular: Negative for chest pain.  Gastrointestinal: Negative for heartburn and nausea.  Genitourinary: Positive for dysuria. Negative for urgency and frequency.  Musculoskeletal: Negative for myalgias.  Skin: Negative for rash.  Neurological: Negative for headaches.    Dilation: 5.5 Effacement (%): 50 Station: -3 Exam by:: Dr Natale Milch Blood pressure 114/70, pulse 91, temperature 98.7 F (37.1 C), temperature source Oral, resp. rate 20, height 5' 4.25" (1.632 m), weight 69.514 kg (153 lb 4 oz), last menstrual period 09/18/2009. Maternal Exam:  Uterine Assessment: Contraction strength is moderate.  Contraction duration is 30 seconds. Contraction frequency is irregular.   Abdomen: Fetal presentation: vertex  Introitus: Normal vulva. Normal vagina.  Ferning test: not done.  Nitrazine test: not done. Amniotic fluid character: not assessed.  Pelvis:  adequate for delivery.   Cervix: Cervix evaluated by digital exam.     Physical Exam  Constitutional: She is oriented to person, place, and time. She appears well-developed and well-nourished. She appears distressed.  HENT:  Head: Normocephalic.  Eyes: Pupils are equal, round, and reactive to light.  Neck: Normal range of motion.  Cardiovascular: Normal rate, regular rhythm and intact distal pulses.  Exam reveals no gallop and no friction rub.   No murmur heard. Respiratory: Effort normal and breath sounds normal. No respiratory distress. She has no rales. She exhibits no tenderness.  GI: Soft. Bowel sounds are normal. She exhibits no distension. There is no tenderness.  Genitourinary: Vagina normal and uterus normal.  Musculoskeletal: Normal range of motion.  Neurological: She is alert and oriented to person, place, and time. She has normal reflexes. She displays normal reflexes. No cranial nerve deficit. She exhibits normal muscle tone. Coordination normal.  Skin: Skin is warm and dry. No rash noted. She is not diaphoretic. No erythema. No pallor.  Psychiatric: She has a normal mood and affect.   Dilation: 5.5 Effacement (%): 50 Cervical Position: Posterior Station: -3 Presentation: Vertex Exam by:: Dr Natale Milch  Prenatal labs: ABO, Rh: O/POS/-- (03/21 1355) Antibody: NEG (03/21 1355) Rubella: 130.1 (03/21 1355) RPR: NON REAC (07/02 1548)  HBsAg: NEGATIVE (03/21 1355)  HIV: NON REACTIVE (07/02 1548)  GBS: NEGATIVE (09/20 1548)   Assessment/Plan: Active Labor 1. Admit to L&D 2. GBS Neg: no abx needed at this time 3. Plans to breastfeed and bottle feed 4. Plans for Depo  Shakina Choy N 10/18/2010, 1:52 AM

## 2010-10-19 ENCOUNTER — Inpatient Hospital Stay (HOSPITAL_COMMUNITY)
Admission: AD | Admit: 2010-10-19 | Discharge: 2010-10-19 | Disposition: A | Discharge status: Home / Self Care | Payer: Medicaid Other | Source: Ambulatory Visit | Attending: Obstetrics and Gynecology | Admitting: Obstetrics and Gynecology

## 2010-10-19 DIAGNOSIS — O368190 Decreased fetal movements, unspecified trimester, not applicable or unspecified: Secondary | ICD-10-CM | POA: Insufficient documentation

## 2010-10-19 LAB — URINE CULTURE
Colony Count: NO GROWTH
Culture: NO GROWTH

## 2010-10-19 NOTE — Progress Notes (Signed)
Having mucus bloody discharge, decreased fetal movement, felt kicking in lobby

## 2010-10-19 NOTE — ED Provider Notes (Signed)
History   In with c/o spotting and decreased fetal movement. Had vag exams yesterday and was obs for several hrs with no cervical change @4 /80/-2.  Chief Complaint  Patient presents with  . Vaginal Discharge    pink sometimes bright orange, mucus  . Decreased Fetal Movement   HPI  OB History    Grav Para Term Preterm Abortions TAB SAB Ect Mult Living   3 1 1  0 1     1      Past Medical History  Diagnosis Date  . Headache     freq during allergy season    Past Surgical History  Procedure Date  . Induced abortion     No family history on file.  History  Substance Use Topics  . Smoking status: Never Smoker   . Smokeless tobacco: Never Used  . Alcohol Use: No    Allergies: No Known Allergies  Prescriptions prior to admission  Medication Sig Dispense Refill  . acetaminophen (TYLENOL) 325 MG tablet Take 650 mg by mouth as needed. For pain       . Prenatal Vit-DSS-Fe Cbn-FA (PRENATAL AD) tablet Take 1 tablet by mouth daily.  30 tablet  11    Review of Systems  Constitutional: Negative.   HENT: Negative.   Eyes: Negative.   Respiratory: Negative.   Cardiovascular: Negative.   Gastrointestinal: Negative.   Genitourinary: Negative.   Musculoskeletal: Negative.   Skin: Negative.   Neurological: Negative.   Endo/Heme/Allergies: Negative.   Psychiatric/Behavioral: Negative.    Physical Exam   Blood pressure 112/72, pulse 107, temperature 98.3 F (36.8 C), temperature source Oral, resp. rate 16, height 5\' 4"  (1.626 m), weight 70.035 kg (154 lb 6.4 oz), last menstrual period 09/18/2009.  Physical Exam  Constitutional: She is oriented to person, place, and time. She appears well-developed and well-nourished.  HENT:  Head: Normocephalic.  Cardiovascular: Normal rate and regular rhythm.   Respiratory: Effort normal and breath sounds normal.  GI: Soft. Bowel sounds are normal.  Genitourinary: Vagina normal and uterus normal.  Musculoskeletal: Normal range of  motion.  Neurological: She is alert and oriented to person, place, and time. She has normal reflexes.  Skin: Skin is warm and dry.  Psychiatric: She has a normal mood and affect. Her behavior is normal. Judgment and thought content normal.    MAU Course  Procedures  MDM   Assessment and Plan  D/C home with reactive NST.  Zerita Boers 10/19/2010, 2:48 PM

## 2010-10-20 ENCOUNTER — Encounter (HOSPITAL_COMMUNITY): Payer: Self-pay | Admitting: Obstetrics

## 2010-10-20 ENCOUNTER — Inpatient Hospital Stay (HOSPITAL_COMMUNITY)
Admission: AD | Admit: 2010-10-20 | Discharge: 2010-10-22 | DRG: 775 | Disposition: A | Discharge status: Home / Self Care | Payer: Medicaid Other | Source: Ambulatory Visit | Attending: Obstetrics & Gynecology | Admitting: Obstetrics & Gynecology

## 2010-10-20 ENCOUNTER — Inpatient Hospital Stay (HOSPITAL_COMMUNITY): Payer: Medicaid Other | Admitting: Anesthesiology

## 2010-10-20 ENCOUNTER — Encounter (HOSPITAL_COMMUNITY): Payer: Self-pay | Admitting: Anesthesiology

## 2010-10-20 LAB — CBC
MCH: 22.7 pg — ABNORMAL LOW (ref 26.0–34.0)
MCV: 72.1 fL — ABNORMAL LOW (ref 78.0–100.0)
Platelets: 207 10*3/uL (ref 150–400)
RDW: 14.2 % (ref 11.5–15.5)
WBC: 7.6 10*3/uL (ref 4.0–10.5)

## 2010-10-20 MED ORDER — LACTATED RINGERS IV SOLN
500.0000 mL | Freq: Once | INTRAVENOUS | Status: AC
  Administered 2010-10-20: 500 mL via INTRAVENOUS

## 2010-10-20 MED ORDER — ACETAMINOPHEN 325 MG PO TABS: 650.0000 mg | ORAL_TABLET | ORAL | Status: DC | PRN

## 2010-10-20 MED ORDER — FENTANYL 2.5 MCG/ML BUPIVACAINE 1/10 % EPIDURAL INFUSION (WH - ANES)
INTRAMUSCULAR | Status: DC | PRN
  Administered 2010-10-20: 14 mL/h via EPIDURAL

## 2010-10-20 MED ORDER — BUTORPHANOL TARTRATE 2 MG/ML IJ SOLN
1.0000 mg | INTRAMUSCULAR | Status: DC | PRN
  Filled 2010-10-20: qty 1

## 2010-10-20 MED ORDER — LACTATED RINGERS IV SOLN: 500.0000 mL | INTRAVENOUS | Status: DC | PRN

## 2010-10-20 MED ORDER — ONDANSETRON HCL 4 MG/2ML IJ SOLN: 4.0000 mg | Freq: Four times a day (QID) | INTRAMUSCULAR | Status: DC | PRN

## 2010-10-20 MED ORDER — TERBUTALINE SULFATE 1 MG/ML IJ SOLN: 0.2500 mg | Freq: Once | INTRAMUSCULAR | Status: AC | PRN

## 2010-10-20 MED ORDER — DIPHENHYDRAMINE HCL 50 MG/ML IJ SOLN: 12.5000 mg | INTRAMUSCULAR | Status: DC | PRN

## 2010-10-20 MED ORDER — LIDOCAINE HCL 1.5 % IJ SOLN
INTRAMUSCULAR | Status: DC | PRN
  Administered 2010-10-20 (×2): 5 mL via EPIDURAL

## 2010-10-20 MED ORDER — LACTATED RINGERS IV SOLN
INTRAVENOUS | Status: DC
  Administered 2010-10-20: 16:00:00 via INTRAVENOUS

## 2010-10-20 MED ORDER — EPHEDRINE 5 MG/ML INJ
10.0000 mg | INTRAVENOUS | Status: DC | PRN
  Administered 2010-10-21: 10 mg via INTRAVENOUS
  Filled 2010-10-20: qty 4

## 2010-10-20 MED ORDER — OXYCODONE-ACETAMINOPHEN 5-325 MG PO TABS: 2.0000 | ORAL_TABLET | ORAL | Status: DC | PRN

## 2010-10-20 MED ORDER — EPHEDRINE 5 MG/ML INJ
10.0000 mg | INTRAVENOUS | Status: DC | PRN
  Administered 2010-10-21: 10 mg via INTRAVENOUS
  Filled 2010-10-20 (×3): qty 4

## 2010-10-20 MED ORDER — OXYTOCIN 20 UNITS IN LACTATED RINGERS INFUSION - SIMPLE
1.0000 m[IU]/min | INTRAVENOUS | Status: DC
  Administered 2010-10-20: 2 m[IU]/min via INTRAVENOUS
  Administered 2010-10-20: 6 m[IU]/min via INTRAVENOUS
  Filled 2010-10-20: qty 1000

## 2010-10-20 MED ORDER — FLEET ENEMA 7-19 GM/118ML RE ENEM: 1.0000 | ENEMA | RECTAL | Status: DC | PRN

## 2010-10-20 MED ORDER — IBUPROFEN 600 MG PO TABS
600.0000 mg | ORAL_TABLET | Freq: Four times a day (QID) | ORAL | Status: DC | PRN
  Administered 2010-10-21: 600 mg via ORAL
  Filled 2010-10-20: qty 1

## 2010-10-20 MED ORDER — FENTANYL 2.5 MCG/ML BUPIVACAINE 1/10 % EPIDURAL INFUSION (WH - ANES)
14.0000 mL/h | INTRAMUSCULAR | Status: DC
  Filled 2010-10-20: qty 60

## 2010-10-20 MED ORDER — OXYTOCIN BOLUS FROM INFUSION
500.0000 mL | Freq: Once | INTRAVENOUS | Status: AC
  Administered 2010-10-20: 500 mL via INTRAVENOUS
  Filled 2010-10-20: qty 500

## 2010-10-20 MED ORDER — LIDOCAINE HCL (PF) 1 % IJ SOLN
30.0000 mL | INTRAMUSCULAR | Status: DC | PRN
  Filled 2010-10-20 (×2): qty 30

## 2010-10-20 MED ORDER — PHENYLEPHRINE 40 MCG/ML (10ML) SYRINGE FOR IV PUSH (FOR BLOOD PRESSURE SUPPORT)
80.0000 ug | PREFILLED_SYRINGE | INTRAVENOUS | Status: DC | PRN
  Filled 2010-10-20 (×2): qty 5

## 2010-10-20 MED ORDER — OXYTOCIN 20 UNITS IN LACTATED RINGERS INFUSION - SIMPLE: 125.0000 mL/h | Freq: Once | INTRAVENOUS | Status: DC

## 2010-10-20 MED ORDER — PHENYLEPHRINE 40 MCG/ML (10ML) SYRINGE FOR IV PUSH (FOR BLOOD PRESSURE SUPPORT)
80.0000 ug | PREFILLED_SYRINGE | INTRAVENOUS | Status: DC | PRN
  Filled 2010-10-20: qty 5

## 2010-10-20 MED ORDER — CITRIC ACID-SODIUM CITRATE 334-500 MG/5ML PO SOLN: 30.0000 mL | ORAL | Status: DC | PRN

## 2010-10-20 NOTE — H&P (Signed)
Abigail Phelps is a 20 y.o. female presenting for onset of labor. Maternal Medical History:  Reason for admission: Reason for admission: rupture of membranes and nausea.  Contractions: Onset was 1-2 hours ago.    Fetal activity: Perceived fetal activity is normal.   Last perceived fetal movement was within the past hour.    Prenatal complications: No bleeding, hypertension, infection, IUGR or pre-eclampsia.   Prenatal Complications - Diabetes: none.    OB History    Grav Para Term Preterm Abortions TAB SAB Ect Mult Living   3 1 1  0 1     1     Past Medical History  Diagnosis Date  . Headache     freq during allergy season   Past Surgical History  Procedure Date  . Induced abortion    Family History: family history is not on file. Social History:  reports that she has never smoked. She has never used smokeless tobacco. She reports that she does not drink alcohol or use illicit drugs.  Review of Systems  Constitutional: Negative for fever and chills.  Respiratory: Negative for cough, shortness of breath and wheezing.   Cardiovascular: Negative for chest pain and palpitations.  Gastrointestinal: Positive for nausea.  Genitourinary: Negative for dysuria.  Skin: Negative.   Neurological: Negative for dizziness.    Dilation: 5 Effacement (%): 70 Exam by:: [place] MD Blood pressure 114/52, pulse 69, temperature 98 F (36.7 C), temperature source Oral, height 5\' 4"  (1.626 m), weight 70.308 kg (155 lb), last menstrual period 09/18/2009. Exam Physical Exam  Constitutional: She is oriented to person, place, and time. She appears well-developed and well-nourished. No distress.  Cardiovascular: Normal rate, regular rhythm and normal heart sounds.  Exam reveals no friction rub.   No murmur heard. Respiratory: Effort normal and breath sounds normal. No respiratory distress. She has no wheezes. She has no rales.  Neurological: She is alert and oriented to person, place, and time.   Skin: Skin is warm and dry.  Psychiatric: She has a normal mood and affect. Her behavior is normal. Thought content normal.    Prenatal labs: ABO, Rh: O/POS/-- (03/21 1355) Antibody: NEG (03/21 1355) Rubella: 130.1 (03/21 1355) RPR: NON REACTIVE (10/06 0155)  HBsAg: NEGATIVE (03/21 1355)  HIV: NON REACTIVE (07/02 1548)  GBS: NEGATIVE (09/20 1548)   Assessment/Plan: 44 [id number] presents for rupture of membranes, admit for labor and delivery She is a family practice patient of Dr. Sherron Flemings Cruz's who has been notified. Start Pitocin augmentation  Patient plans to breast and bottle feed Patient plans implanon for birth control  Legacy Carrender 10/20/2010, 3:23 PM

## 2010-10-20 NOTE — Progress Notes (Signed)
10/20/10 2208  Pain Management  Labor Pain Relief Epidural requested  Pt changed her mind and want epidural

## 2010-10-20 NOTE — Anesthesia Preprocedure Evaluation (Signed)
Anesthesia Evaluation  Name, MR# and DOB Patient awake  General Assessment Comment  Reviewed: Allergy & Precautions, H&P , Patient's Chart, lab work & pertinent test results  Airway Mallampati: I TM Distance: >3 FB Neck ROM: full    Dental No notable dental hx.    Pulmonary    Pulmonary exam normal       Cardiovascular     Neuro/Psych Negative Psych ROS   GI/Hepatic negative GI ROS Neg liver ROS    Endo/Other  Negative Endocrine ROS  Renal/GU negative Renal ROS     Musculoskeletal negative musculoskeletal ROS (+)   Abdominal Normal abdominal exam  (+)   Peds  Hematology negative hematology ROS (+)   Anesthesia Other Findings   Reproductive/Obstetrics (+) Pregnancy                           Anesthesia Physical Anesthesia Plan  ASA: II  Anesthesia Plan: Epidural   Post-op Pain Management:    Induction:   Airway Management Planned:   Additional Equipment:   Intra-op Plan:   Post-operative Plan:   Informed Consent: I have reviewed the patients History and Physical, chart, labs and discussed the procedure including the risks, benefits and alternatives for the proposed anesthesia with the patient or authorized representative who has indicated his/her understanding and acceptance.     Plan Discussed with:   Anesthesia Plan Comments:         Anesthesia Quick Evaluation

## 2010-10-20 NOTE — Anesthesia Procedure Notes (Signed)
Epidural Patient location during procedure: OB Start time: 10/20/2010 10:31 PM End time: 10/20/2010 10:36 PM Reason for block: procedure for pain  Staffing Anesthesiologist: Sandrea Hughs Performed by: anesthesiologist   Preanesthetic Checklist Completed: patient identified, site marked, surgical consent, pre-op evaluation, timeout performed, IV checked, risks and benefits discussed and monitors and equipment checked  Epidural Patient position: sitting Prep: site prepped and draped and DuraPrep Patient monitoring: continuous pulse ox and blood pressure Approach: midline Injection technique: LOR air  Needle:  Needle type: Tuohy  Needle gauge: 17 G Needle length: 9 cm Needle insertion depth: 5 cm cm Catheter type: closed end flexible Catheter size: 19 Gauge Catheter at skin depth: 10 cm Test dose: negative and 1.5% lidocaine  Assessment Sensory level: T8 Events: blood not aspirated, injection not painful, no injection resistance, negative IV test and no paresthesia

## 2010-10-20 NOTE — Progress Notes (Signed)
Abigail Phelps is a 20 y.o. G3P1011 at [redacted]w[redacted]d  admitted for rupture of membranes  Subjective:   Objective: BP 114/59  Pulse 94  Temp(Src) 98 F (36.7 C) (Oral)  Resp 18  Ht 5\' 4"  (1.626 m)  Wt 70.308 kg (155 lb)  BMI 26.61 kg/m2  LMP 09/18/2009      FHT:  FHR: 140s bpm, variability: moderate,  accelerations:  Present,  decelerations:  Absent UC:   regular, every 3-4  minutes Dilation: 6 Effacement (%): 80 Cervical Position: Posterior Station: -1 Presentation: Vertex Exam by:: Celester Morgan  Labs: Lab Results  Component Value Date   WBC 7.6 10/20/2010   HGB 9.1* 10/20/2010   HCT 28.9* 10/20/2010   MCV 72.1* 10/20/2010   PLT 207 10/20/2010    Assessment / Plan: Augmentation of labor, progressing well, on 10 mU of pitocin Fetal Wellbeing:  Category I Pain Control:  Labor support without medications I/D:  n/a Anticipated MOD:  NSVD  Kamron Portee N 10/20/2010, 7:48 PM

## 2010-10-20 NOTE — Progress Notes (Signed)
Abigail Phelps is a 20 y.o. G3P1011 at [redacted]w[redacted]d by LMP admitted for rupture of membranes  Subjective:   Objective: BP 116/68  Pulse 89  Temp(Src) 98.4 F (36.9 C) (Oral)  Resp 18  Ht 5\' 4"  (1.626 m)  Wt 70.308 kg (155 lb)  BMI 26.61 kg/m2  LMP 09/18/2009      FHT:  FHR: 130-140s bpm, variability: moderate,  accelerations:  Present,  decelerations:  Absent UC:   irregular, every 2-5 minutes SVE:   Dilation: 5.5 Effacement (%): 80 Station: -1 Exam by:: [employer]: Lab Results  Component Value Date   WBC 7.6 10/20/2010   HGB 9.1* 10/20/2010   HCT 28.9* 10/20/2010   MCV 72.1* 10/20/2010   PLT 207 10/20/2010    Assessment / Plan: Augmentation of labor, pitocin at 70mU/min. Barely into an adequate contraction pattern. Fetal Wellbeing:  Category I Pain Control:  Labor support without medications I/D:  n/a Anticipated MOD:  NSVD  Seth Higginbotham N 10/20/2010, 5:33 PM

## 2010-10-20 NOTE — Progress Notes (Signed)
Subjective:  Abigail Phelps is a 20 y.o. G3P1011 at [redacted]w[redacted]d admitted for rupture of membranes   Objective: BP 121/59  Pulse 98  Temp(Src) 98.2 F (36.8 C) (Oral)  Resp 18  Ht 5\' 4"  (1.626 m)  Wt 155 lb (70.308 kg)  BMI 26.61 kg/m2  LMP 09/18/2009      FHT:  FHR: 140 bpm, variability: moderate,  accelerations:  Present,  decelerations:  Absent UC:   regular, every 2-4 minutes SVE:   Dilation: 6 Effacement (%): 90 Station: -1 Exam by:: Lafonda Mosses, RN  Labs: Lab Results  Component Value Date   WBC 7.6 10/20/2010   HGB 9.1* 10/20/2010   HCT 28.9* 10/20/2010   MCV 72.1* 10/20/2010   PLT 207 10/20/2010    Assessment / Plan: Augmentation of labor, progressing well on 12 pit  Labor: Progressing on Pitocin, will continue to increase. Fetal Wellbeing:  Category I Pain Control:  desires epidural  I/D:  n/a Anticipated MOD:  NSVD  DE LA CRUZ,Burnice Oestreicher 10/20/2010, 10:09 PM

## 2010-10-20 NOTE — Progress Notes (Signed)
Pt reprots SROM at 1330. Clear fluid. Reports mild contractions

## 2010-10-21 ENCOUNTER — Encounter (HOSPITAL_COMMUNITY): Payer: Self-pay | Admitting: *Deleted

## 2010-10-21 LAB — CBC
HCT: 22.6 % — ABNORMAL LOW (ref 36.0–46.0)
Hemoglobin: 7.2 g/dL — ABNORMAL LOW (ref 12.0–15.0)
MCH: 22.7 pg — ABNORMAL LOW (ref 26.0–34.0)
MCV: 71.3 fL — ABNORMAL LOW (ref 78.0–100.0)
RBC: 3.17 MIL/uL — ABNORMAL LOW (ref 3.87–5.11)

## 2010-10-21 MED ORDER — ONDANSETRON HCL 4 MG/2ML IJ SOLN: 4.0000 mg | INTRAMUSCULAR | Status: DC | PRN

## 2010-10-21 MED ORDER — PRENATAL PLUS 27-1 MG PO TABS
1.0000 | ORAL_TABLET | Freq: Every day | ORAL | Status: DC
  Administered 2010-10-21 – 2010-10-22 (×2): 1 via ORAL
  Filled 2010-10-21 (×2): qty 1

## 2010-10-21 MED ORDER — DIBUCAINE 1 % RE OINT: 1.0000 "application " | TOPICAL_OINTMENT | RECTAL | Status: DC | PRN

## 2010-10-21 MED ORDER — ZOLPIDEM TARTRATE 5 MG PO TABS: 5.0000 mg | ORAL_TABLET | Freq: Every evening | ORAL | Status: DC | PRN

## 2010-10-21 MED ORDER — OXYCODONE-ACETAMINOPHEN 5-325 MG PO TABS
1.0000 | ORAL_TABLET | ORAL | Status: DC | PRN
  Administered 2010-10-21 – 2010-10-22 (×2): 1 via ORAL
  Filled 2010-10-21 (×2): qty 1

## 2010-10-21 MED ORDER — IBUPROFEN 600 MG PO TABS
600.0000 mg | ORAL_TABLET | Freq: Four times a day (QID) | ORAL | Status: DC
  Administered 2010-10-21 – 2010-10-22 (×6): 600 mg via ORAL
  Filled 2010-10-21 (×6): qty 1

## 2010-10-21 MED ORDER — ONDANSETRON HCL 4 MG PO TABS: 4.0000 mg | ORAL_TABLET | ORAL | Status: DC | PRN

## 2010-10-21 MED ORDER — BENZOCAINE-MENTHOL 20-0.5 % EX AERO
1.0000 "application " | INHALATION_SPRAY | CUTANEOUS | Status: DC | PRN
  Administered 2010-10-21: 1 via TOPICAL

## 2010-10-21 MED ORDER — DIPHENHYDRAMINE HCL 25 MG PO CAPS
25.0000 mg | ORAL_CAPSULE | Freq: Four times a day (QID) | ORAL | Status: DC | PRN
  Administered 2010-10-21: 25 mg via ORAL
  Filled 2010-10-21: qty 1

## 2010-10-21 MED ORDER — TETANUS-DIPHTH-ACELL PERTUSSIS 5-2.5-18.5 LF-MCG/0.5 IM SUSP
0.5000 mL | Freq: Once | INTRAMUSCULAR | Status: DC
Start: 2010-10-22 — End: 2010-10-22

## 2010-10-21 MED ORDER — WITCH HAZEL-GLYCERIN EX PADS: 1.0000 "application " | MEDICATED_PAD | CUTANEOUS | Status: DC | PRN

## 2010-10-21 MED ORDER — SIMETHICONE 80 MG PO CHEW: 80.0000 mg | CHEWABLE_TABLET | ORAL | Status: DC | PRN

## 2010-10-21 MED ORDER — BENZOCAINE-MENTHOL 20-0.5 % EX AERO
INHALATION_SPRAY | CUTANEOUS | Status: AC
  Administered 2010-10-21: 1 via TOPICAL
  Filled 2010-10-21: qty 56

## 2010-10-21 MED ORDER — SENNOSIDES-DOCUSATE SODIUM 8.6-50 MG PO TABS: 2.0000 | ORAL_TABLET | Freq: Every day | ORAL | Status: DC

## 2010-10-21 MED ORDER — LANOLIN HYDROUS EX OINT: TOPICAL_OINTMENT | CUTANEOUS | Status: DC | PRN

## 2010-10-21 NOTE — Progress Notes (Signed)
UR chart review completed.  

## 2010-10-21 NOTE — Anesthesia Postprocedure Evaluation (Signed)
  Anesthesia Post-op Note  Patient: Abigail Phelps  Procedure(s) Performed: * No procedures listed *  Patient Location: Mother/Baby  Anesthesia Type: Epidural  Level of Consciousness: awake, alert , oriented and patient cooperative  Airway and Oxygen Therapy: Patient Spontanous Breathing  Post-op Pain: none  Post-op Assessment: Post-op Vital signs reviewed and Patient's Cardiovascular Status Stable  Post-op Vital Signs: Reviewed and stable  Complications: No apparent anesthesia complications

## 2010-10-21 NOTE — Progress Notes (Signed)
Post Partum Day 1 Subjective: up ad lib, voiding, tolerating PO and mild pelvic cramping Patient states bleeding has decreased.   Objective: Blood pressure 105/62, pulse 85, temperature 98.1 F (36.7 C), temperature source Oral, resp. rate 16, height 5\' 4"  (1.626 m), weight 70.308 kg (155 lb), last menstrual period 09/18/2009, SpO2 100.00%, unknown if currently breastfeeding.  Physical Exam:  General: alert, cooperative and no distress Lochia: significant amount of lochia on pad this morning 1 hour after change. Uterine Fundus: firm DVT Evaluation: No evidence of DVT seen on physical exam. No cords or calf tenderness.   Basename 10/20/10 1531  HGB 9.1*  HCT 28.9*    Assessment/Plan: Plan for discharge tomorrow, Breastfeeding and Contraception ; plans to get Nexplanon after 6 week PP visit   LOS: 1 day   Judith Blonder 10/21/2010, 7:38 AM   I have read and agree with above.   Si Raider Clinton Sawyer

## 2010-10-21 NOTE — Progress Notes (Signed)
10/20/10 2355  Vital Signs  BP ! 74/32 mmHg  Pulse Rate ! 131   Pt c/o of dizziness and feeling like passing out. Placed in supine position. LR bolus started. Will continue to assess

## 2010-10-21 NOTE — Anesthesia Postprocedure Evaluation (Signed)
Anesthesia Post Note  Patient: Abigail Phelps  Procedure(s) Performed: * No procedures listed *  Anesthesia type: Epidural  Patient location: Mother/Baby  Post pain: Pain level controlled  Post assessment: Post-op Vital signs reviewed  Last Vitals:  Filed Vitals:   10/21/10 1409  BP: 106/62  Pulse: 84  Temp: 98 F (36.7 C)  Resp: 18    Post vital signs: Reviewed  Level of consciousness: awake  Complications: No apparent anesthesia complications

## 2010-10-21 NOTE — Progress Notes (Signed)
10/21/10 0015  Vital Signs  BP 105/87 mmHg  Pulse Rate ! 184   pt feels better now. Placed in semi- fowlers to breastfeed

## 2010-10-22 ENCOUNTER — Encounter (HOSPITAL_COMMUNITY): Payer: Self-pay

## 2010-10-22 MED ORDER — IBUPROFEN 600 MG PO TABS: 600.0000 mg | ORAL_TABLET | Freq: Four times a day (QID) | ORAL | Status: AC

## 2010-10-22 NOTE — Discharge Summary (Signed)
Obstetric Discharge Summary Reason for Admission: onset of labor Prenatal Procedures: NST and ultrasound Intrapartum Procedures: spontaneous vaginal delivery Postpartum Procedures: none Complications-Operative and Postpartum: none Hemoglobin  Date Value Range Status  10/21/2010 7.2* 12.0-15.0 (g/dL) Final     DELTA CHECK NOTED     REPEATED TO VERIFY     HCT  Date Value Range Status  10/21/2010 22.6* 36.0-46.0 (%) Final    Discharge Diagnoses: Term Pregnancy-delivered  Discharge Information: Date: 10/22/2010 Activity: pelvic rest Diet: routine Medications: Ibuprofen Condition: stable Instructions: refer to practice specific booklet Discharge to: home Follow-up Information    Follow up with [facility]. Make an appointment in 6 weeks. (post partum visit)    Contact information:   [address] [place] [address] [phone]          Newborn Data: Live born female  Birth Weight: 6 lb 9.5 oz (2990 g) APGAR: 9, 9  Home with mother.  Judith Blonder 10/22/2010, 7:33 AM

## 2010-10-22 NOTE — Progress Notes (Signed)
Post Partum Day 2 Subjective: no complaints, up ad lib, voiding and tolerating PO  Objective: Blood pressure 93/60, pulse 83, temperature 98 F (36.7 C), temperature source Oral, resp. rate 18, height 5\' 4"  (1.626 m), weight 70.308 kg (155 lb), last menstrual period 09/18/2009, SpO2 100.00%, unknown if currently breastfeeding.  Physical Exam:  General: alert, cooperative and no distress Lochia: appropriate Uterine Fundus: firm DVT Evaluation: No evidence of DVT seen on physical exam. No cords or calf tenderness.   Basename 10/21/10 0941 10/20/10 1531  HGB 7.2* 9.1*  HCT 22.6* 28.9*    Assessment/Plan: Discharge home, Breastfeeding and Contraception ; plans for nexplanon after 6 week PP visit Patient instructed to call [facility] to schedule 6 week PP visit   LOS: 2 days   Abigail Phelps 10/22/2010, 7:28 AM

## 2010-10-22 NOTE — H&P (Signed)
Patient seen and examined with PA-S Rochel Brome, agree with documentation.

## 2010-10-22 NOTE — Discharge Summary (Signed)
I have seen and examined this patient in conjunction with [name], PA-S.  I have taken this history and preformed the exam.  I agree with the note as written above and have made corrections as needed.   Abigail Phelps 10/22/2010 2:35 PM

## 2010-10-22 NOTE — Discharge Summary (Signed)
Obstetric Discharge Summary Reason for Admission: onset of labor Prenatal Procedures: none Intrapartum Procedures: spontaneous vaginal delivery Postpartum Procedures: none Complications-Operative and Postpartum: none Hemoglobin  Date Value Range Status  10/21/2010 7.2* 12.0-15.0 (g/dL) Final     DELTA CHECK NOTED     REPEATED TO VERIFY     HCT  Date Value Range Status  10/21/2010 22.6* 36.0-46.0 (%) Final    Discharge Diagnoses: Term Pregnancy-delivered  Discharge Information: Date: 10/22/2010 Activity: pelvic rest Diet: routine Medications: PNV and Ibuprofen Condition: stable Instructions: refer to practice specific booklet Discharge to: home Follow-up Information    Follow up with [facility]. Make an appointment in 6 weeks. (post partum visit)    Contact information:   [address] [place] [address] [phone]          Newborn Data: Live born female  Birth Weight: 6 lb 9.5 oz (2990 g) APGAR: 9, 9  Home with mother.  Abigail Phelps, Abigail Phelps 10/22/2010, 2:34 PM

## 2010-10-22 NOTE — Progress Notes (Signed)
I have seen and examined this patient in conjunction with [name], PA-S.  I have taken this history and preformed the exam.  I agree with the note as written above and have made corrections as needed.   Abigail Phelps 10/22/2010 2:36 PM

## 2010-10-23 ENCOUNTER — Encounter: Payer: Medicaid Other | Admitting: Family Medicine

## 2010-12-10 ENCOUNTER — Ambulatory Visit: Payer: Medicaid Other | Admitting: Family Medicine

## 2010-12-29 ENCOUNTER — Ambulatory Visit: Payer: Medicaid Other | Admitting: Family Medicine

## 2011-01-09 ENCOUNTER — Ambulatory Visit (INDEPENDENT_AMBULATORY_CARE_PROVIDER_SITE_OTHER): Payer: Medicaid Other | Admitting: Family Medicine

## 2011-01-09 ENCOUNTER — Encounter: Payer: Self-pay | Admitting: Family Medicine

## 2011-01-09 VITALS — BP 105/73 | HR 64 | Temp 97.5°F | Ht 64.0 in | Wt 137.9 lb

## 2011-01-09 DIAGNOSIS — N760 Acute vaginitis: Secondary | ICD-10-CM

## 2011-01-09 DIAGNOSIS — A499 Bacterial infection, unspecified: Secondary | ICD-10-CM

## 2011-01-09 DIAGNOSIS — Z309 Encounter for contraceptive management, unspecified: Secondary | ICD-10-CM

## 2011-01-09 DIAGNOSIS — B9689 Other specified bacterial agents as the cause of diseases classified elsewhere: Secondary | ICD-10-CM

## 2011-01-09 LAB — POCT WET PREP (WET MOUNT): Trichomonas Wet Prep HPF POC: NEGATIVE

## 2011-01-09 LAB — POCT URINE PREGNANCY: Preg Test, Ur: NEGATIVE

## 2011-01-09 MED ORDER — METRONIDAZOLE 500 MG PO TABS: 500.0000 mg | ORAL_TABLET | Freq: Three times a day (TID) | ORAL | Status: AC

## 2011-01-09 MED ORDER — METRONIDAZOLE 500 MG PO TABS: 500.0000 mg | ORAL_TABLET | Freq: Three times a day (TID) | ORAL | Status: DC

## 2011-01-09 MED ORDER — MEDROXYPROGESTERONE ACETATE 150 MG/ML IM SUSP: 150.0000 mg | Freq: Once | INTRAMUSCULAR | Status: DC

## 2011-01-09 NOTE — Progress Notes (Signed)
  Subjective:     Abigail Phelps is a 20 y.o. female who presents for a postpartum visit. She is 8 weeks postpartum following a spontaneous vaginal delivery. I have fully reviewed the prenatal and intrapartum course. The delivery was at term gestational weeks. Outcome: spontaneous vaginal delivery. Anesthesia: IV sedation. Postpartum course has been uncomplicated. Baby's course has been normal. Baby is feeding by bottle - Carn[employer] Good Start. Bleeding no bleeding. Bowel function is normal. Bladder function is normal. Patient is sexually active. Contraception method is Depo-Provera injections and then Nexplanon.  Postpartum depression screening: negative.  The following portions of the patient's history were reviewed and updated as appropriate: allergies, current medications, past medical history and problem list.  Review of Systems Pertinent items are noted in HPI.   Objective:    BP 105/73  Pulse 64  Temp(Src) 97.5 F (36.4 C) (Oral)  Ht 5\' 4"  (1.626 m)  Wt 137 lb 14.4 oz (62.551 kg)  BMI 23.67 kg/m2  General:  alert, cooperative and no distress  Lungs: clear to auscultation bilaterally  Heart:  regular rate and rhythm, S1, S2 normal, no murmur, click, rub or gallop  Abdomen: soft, non-tender; bowel sounds normal; no masses,  no organomegaly   Vulva:  normal  Vagina: vagina positive for thick, white discharge  Cervix:  no cervical motion tenderness and no lesions  Corpus: normal  Adnexa:  no mass, fullness, tenderness        Assessment:     8 week postpartum exam.  Bacterial Vaginosis.  Pap smear not done at today's visit.   Plan:     1. Contraception: Depo-Provera injections and Nexplanon.  2. BV: many clue cells on wet prep; Flagyl 500 TID x 7 days. Handout given. 3. Follow up as needed.  [facility] appointment [facility] in 2-3 months.

## 2011-01-09 NOTE — Patient Instructions (Addendum)
Please schedule an appointment for Nexplanon with Dr. Jennette [facility] in [facility]. Take Flagyl as directed for BV. Follow up with PCP as needed. Have a happy New Year!  Bacterial Vaginosis Bacterial vaginosis (BV) is a vaginal infection where the normal balance of bacteria in the vagina is disrupted. The normal balance is then replaced by an overgrowth of certain bacteria. There are several different kinds of bacteria that can cause BV. BV is the most common vaginal infection in women of childbearing age. CAUSES   The cause of BV is not fully understood. BV develops when there is an increase or imbalance of harmful bacteria.   Some activities or behaviors can upset the normal balance of bacteria in the vagina and put women at increased risk including:   Having a new sex partner or multiple sex partners.   Douching.   Using an intrauterine device (IUD) for contraception.   It is not clear what role sexual activity plays in the development of BV. However, women that have never had sexual intercourse are rarely infected with BV.  Women do not get BV from toilet seats, bedding, swimming pools or from touching objects around them.  SYMPTOMS   Grey vaginal discharge.   A fish-like odor with discharge, especially after sexual intercourse.   Itching or burning of the vagina and vulva.   Burning or pain with urination.   Some women have no signs or symptoms at all.  DIAGNOSIS  Your caregiver must examine the vagina for signs of BV. Your caregiver will perform lab tests and look at the sample of vaginal fluid through a microscope. They will look for bacteria and abnormal cells (clue cells), a pH test higher than 4.5, and a positive amine test all associated with BV.  RISKS AND COMPLICATIONS   Pelvic inflammatory disease (PID).   Infections following gynecology surgery.   Developing HIV.   Developing herpes virus.  TREATMENT  Sometimes BV will clear up without treatment. However, all  women with symptoms of BV should be treated to avoid complications, especially if gynecology surgery is planned. Female partners generally do not need to be treated. However, BV may spread between female sex partners so treatment is helpful in preventing a recurrence of BV.   BV may be treated with antibiotics. The antibiotics come in either pill or vaginal cream forms. Either can be used with nonpregnant or pregnant women, but the recommended dosages differ. These antibiotics are not harmful to the baby.   BV can recur after treatment. If this happens, a second round of antibiotics will often be prescribed.   Treatment is important for pregnant women. If not treated, BV can cause a premature delivery, especially for a pregnant woman who had a premature birth in the past. All pregnant women who have symptoms of BV should be checked and treated.   For chronic reoccurrence of BV, treatment with a type of prescribed gel vaginally twice a week is helpful.  HOME CARE INSTRUCTIONS   Finish all medication as directed by your caregiver.   Do not have sex until treatment is completed.   Tell your sexual partner that you have a vaginal infection. They should see their caregiver and be treated if they have problems, such as a mild rash or itching.   Practice safe sex. Use condoms. Only have 1 sex partner.  PREVENTION  Basic prevention steps can help reduce the risk of upsetting the natural balance of bacteria in the vagina and developing BV:  Do not  have sexual intercourse (be abstinent).   Do not douche.   Use all of the medicine prescribed for treatment of BV, even if the signs and symptoms go away.   Tell your sex partner if you have BV. That way, they can be treated, if needed, to prevent reoccurrence.  SEEK MEDICAL CARE IF:   Your symptoms are not improving after 3 days of treatment.   You have increased discharge, pain, or fever.  MAKE SURE YOU:   Understand these instructions.   Will  watch your condition.   Will get help right away if you are not doing well or get worse.  FOR MORE INFORMATION  Division of STD Prevention (DSTDP), Centers for Disease Control and Prevention: [employer] American Social Health Association (ASHA): www.ashastd.org  Document Released: 12/29/2004 Document Revised: 09/10/2010 Document Reviewed: 06/21/2008 [facility] Patient Information 2012 [place], [place].

## 2011-02-26 ENCOUNTER — Telehealth: Payer: Self-pay | Admitting: *Deleted

## 2011-02-26 NOTE — Telephone Encounter (Signed)
Attempted to call pt but phone was d/c'ed. Called pt's mother and asked her to have pt to call our office because we will need to reschedule her appt also wanted to know if she would like to change her daughters appt .Marland KitchenLoralee Pacas Narcissa

## 2011-02-27 ENCOUNTER — Ambulatory Visit: Payer: Medicaid Other | Admitting: Family Medicine

## 2011-02-27 NOTE — Telephone Encounter (Signed)
Spoke with pt and informed her that we will need to cancel her appt and reschedule her and her daughter for another day. Pt understood and agreed I told her that I will call her back with a day and time for this.Abigail Phelps

## 2011-03-05 ENCOUNTER — Ambulatory Visit: Payer: Medicaid Other | Admitting: Family Medicine

## 2011-03-30 ENCOUNTER — Ambulatory Visit: Payer: Medicaid Other | Admitting: Family Medicine

## 2012-05-02 ENCOUNTER — Emergency Department (HOSPITAL_COMMUNITY)
Admission: EM | Admit: 2012-05-02 | Discharge: 2012-05-02 | Disposition: A | Discharge status: Home / Self Care | Payer: Self-pay | Attending: Emergency Medicine | Admitting: Emergency Medicine

## 2012-05-02 ENCOUNTER — Encounter (HOSPITAL_COMMUNITY): Payer: Self-pay | Admitting: Emergency Medicine

## 2012-05-02 DIAGNOSIS — S39012A Strain of muscle, fascia and tendon of lower back, initial encounter: Secondary | ICD-10-CM

## 2012-05-02 DIAGNOSIS — IMO0002 Reserved for concepts with insufficient information to code with codable children: Secondary | ICD-10-CM | POA: Insufficient documentation

## 2012-05-02 DIAGNOSIS — Y929 Unspecified place or not applicable: Secondary | ICD-10-CM | POA: Insufficient documentation

## 2012-05-02 DIAGNOSIS — R51 Headache: Secondary | ICD-10-CM | POA: Insufficient documentation

## 2012-05-02 DIAGNOSIS — Y9389 Activity, other specified: Secondary | ICD-10-CM | POA: Insufficient documentation

## 2012-05-02 DIAGNOSIS — X500XXA Overexertion from strenuous movement or load, initial encounter: Secondary | ICD-10-CM | POA: Insufficient documentation

## 2012-05-02 MED ORDER — DIAZEPAM 5 MG PO TABS: 5.0000 mg | ORAL_TABLET | Freq: Four times a day (QID) | ORAL | Status: DC | PRN

## 2012-05-02 MED ORDER — DIAZEPAM 5 MG PO TABS
5.0000 mg | ORAL_TABLET | Freq: Once | ORAL | Status: AC
  Administered 2012-05-02: 5 mg via ORAL
  Filled 2012-05-02: qty 1

## 2012-05-02 NOTE — ED Provider Notes (Signed)
History    This chart was scribed for non-physician practitioner Wynetta Emery, PA-C working with Celene Kras, MD by Gerlean Ren, ED Scribe. This patient was seen in room WTR7/WTR7 and the patient's care was started at 7:05 PM.    CSN: [id number]  Arrival date & time 05/02/12  1700   First MD Initiated Contact with Patient 05/02/12 1837      No chief complaint on file.   The history is provided by the patient. No language interpreter was used.  Abigail Phelps is a 22 y.o. female who presents to the Emergency Department complaining of pain in right upper back that began at 10:30 AM this morning that suddenly began while she was lifting up her young toddler daughter.  Pt has not used anything for pain.  No known allergies. Patient describe describes her pain as severe, 10 out of 10, it is exacerbated by movement and palpation, when she sits still, she has no pain.     Past Medical History  Diagnosis Date  . Headache     freq during allergy season    Past Surgical History  Procedure Laterality Date  . Induced abortion      No family history on file.  History  Substance Use Topics  . Smoking status: Never Smoker   . Smokeless tobacco: Never Used  . Alcohol Use: No    OB History   Grav Para Term Preterm Abortions TAB SAB Ect Mult Living   3 2 2  0 1     2      Review of Systems  Constitutional: Negative for fever.  Respiratory: Negative for shortness of breath.   Cardiovascular: Negative for chest pain.  Gastrointestinal: Negative for nausea, vomiting, abdominal pain and diarrhea.  Musculoskeletal: Positive for back pain.  All other systems reviewed and are negative.    Allergies  Review of patient's allergies indicates no known allergies.  Home Medications   Current Outpatient Rx  Name  Route  Sig  Dispense  Refill  . acetaminophen (TYLENOL) 500 MG tablet   Oral   Take 1,000 mg by mouth every 6 (six) hours as needed for pain.           BP 121/93  Pulse  106  Temp(Src) 99.4 F (37.4 C)  Resp 16  SpO2 100%  LMP 04/16/2012  Physical Exam  Nursing note and vitals reviewed. Constitutional: She is oriented to person, place, and time. She appears well-developed and well-nourished. No distress.  HENT:  Head: Normocephalic.  Eyes: Conjunctivae and EOM are normal.  Neck: Normal range of motion.  Cardiovascular: Normal rate.   Pulmonary/Chest: Effort normal and breath sounds normal. No stridor. No respiratory distress. She has no wheezes. She has no rales. She exhibits no tenderness.  Abdominal: Soft.  Musculoskeletal: Normal range of motion.  No midline tenderness to palpation of thoracic and lumbar spine. No paraspinal musculature spasm or tenderness to palpation. Reduced range of motion in spinal flexion secondary to pain.  Neurological: She is alert and oriented to person, place, and time.  It is 5 out of 5x4 extremities and distal sensation is grossly intact.  Skin: No rash noted.  Psychiatric: She has a normal mood and affect.    ED Course  Procedures (including critical care time) DIAGNOSTIC STUDIES: Oxygen Saturation is 100% on room air, normal by my interpretation.    COORDINATION OF CARE: 7:06 PM- Informed pt that I will treat pain that appears muscular in nature with pain  medicine and muscle relaxers.  Pt verbalizes understanding and agrees with plan.     1. Sprain and strain of back, initial encounter       MDM   Abigail Phelps is a 22 y.o. female with pain to mid back after she lifted her child up. Physical exam is normal, there are no neuro deficits.  Filed Vitals:   05/02/12 1736  BP: 121/93  Pulse: 106  Temp: 99.4 F (37.4 C)  Resp: 16  SpO2: 100%     Pt verbalized understanding and agrees with care plan. Outpatient follow-up and return precautions given.    Discharge Medication List as of 05/02/2012  7:13 PM    START taking these medications   Details  diazepam (VALIUM) 5 MG tablet Take 1 tablet (5 mg  total) by mouth every 6 (six) hours as needed (muscle spasms)., Starting 05/02/2012, Until Discontinued, Print        I personally performed the services described in this documentation, which was scribed in my presence. The recorded information has been reviewed and is accurate.   Wynetta Emery, PA-C 05/02/12 2149

## 2012-05-02 NOTE — ED Provider Notes (Signed)
Medical screening examination/treatment/procedure(s) were performed by non-physician practitioner and as supervising physician I was immediately available for consultation/collaboration.    Rochel Privett R Lovie Agresta, MD 05/02/12 2231 

## 2012-05-02 NOTE — ED Notes (Signed)
Pt was playing with her child today and her back started hurting when she placed her child back down. Pt reports back pain upper right 9/10. Pt also denies any urinary freq or burning.

## 2012-09-23 IMAGING — US US OB COMP +14 WK
1 series · 12 of 28 positions shown · non-contrast
Comparison: none

[Series 1: us ob comp +14 wk · 12 of 65 slices shown]
[im 3/65]
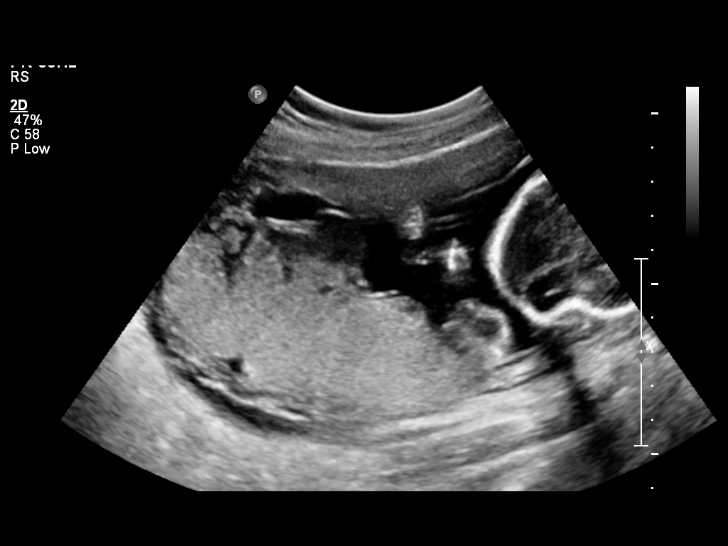
[im 8/65]
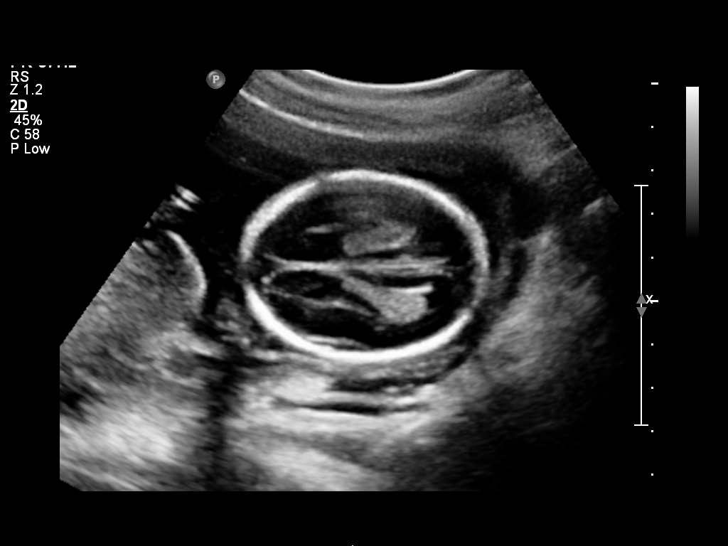
[im 12/65]
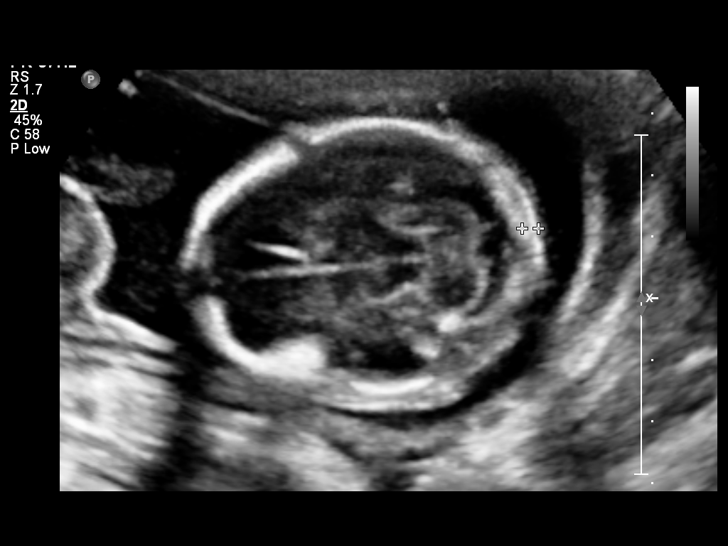
[im 19/65]
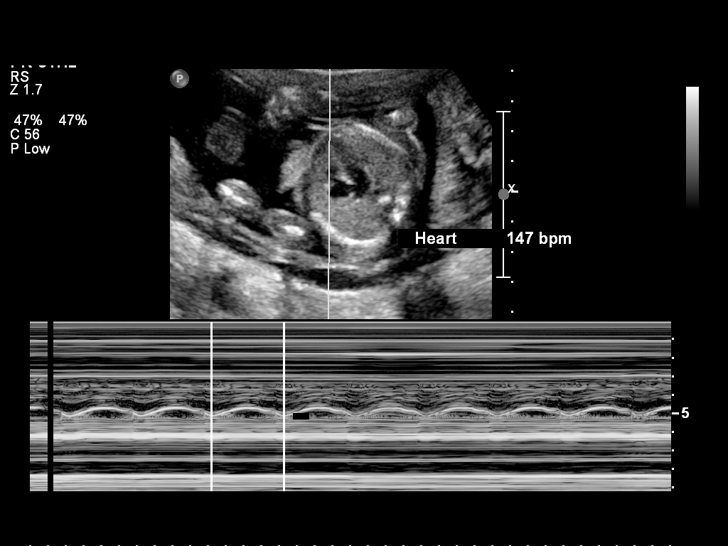
[im 24/65]
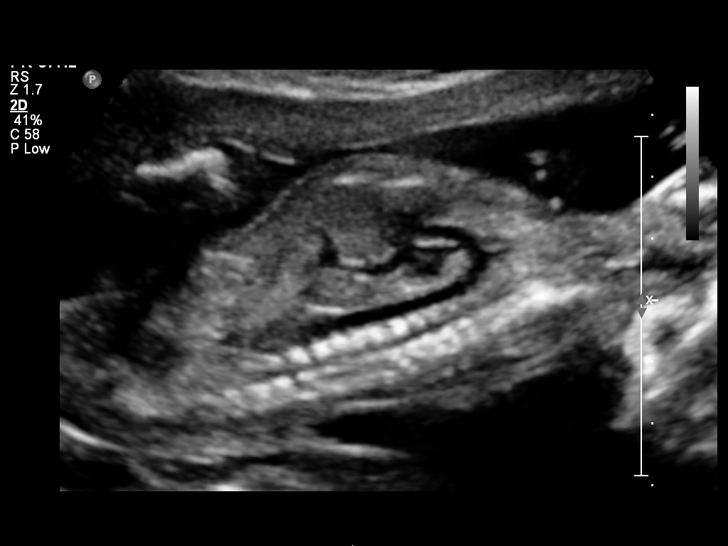
[im 29/65]
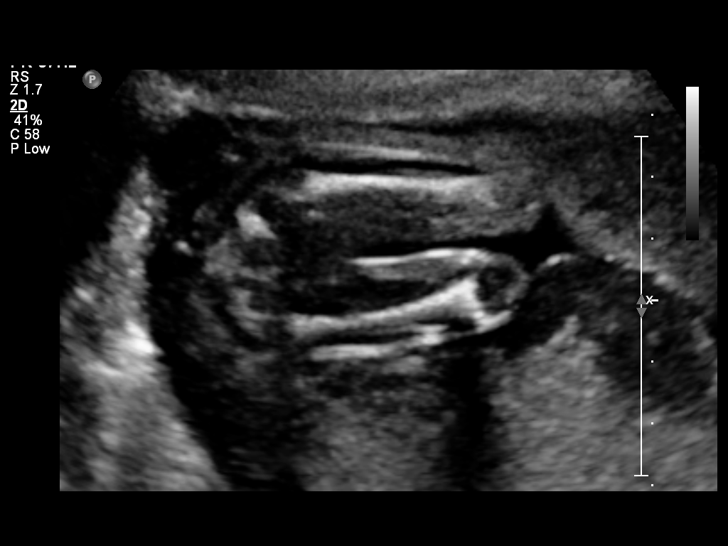
[im 36/65]
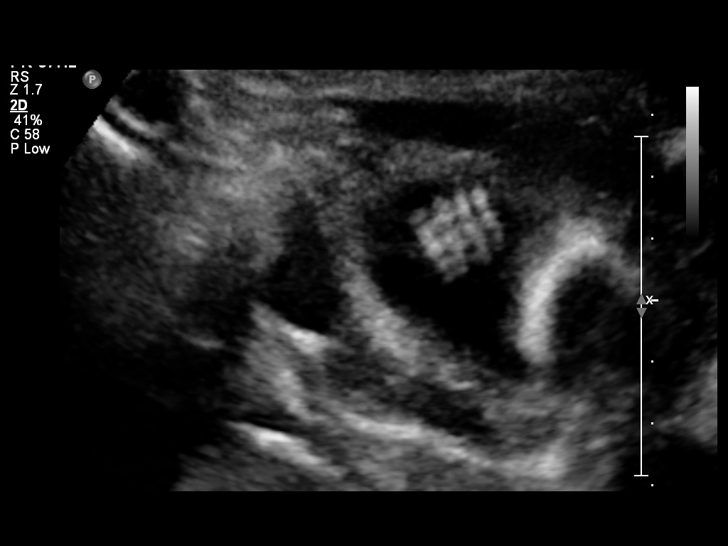
[im 41/65]
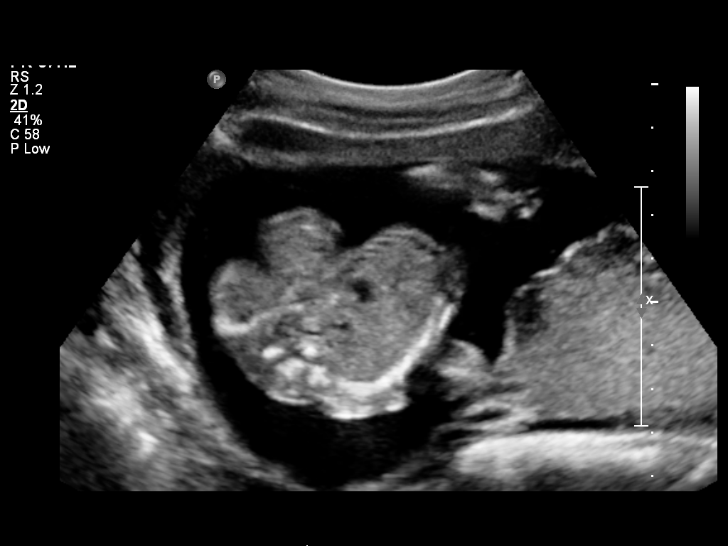
[im 46/65]
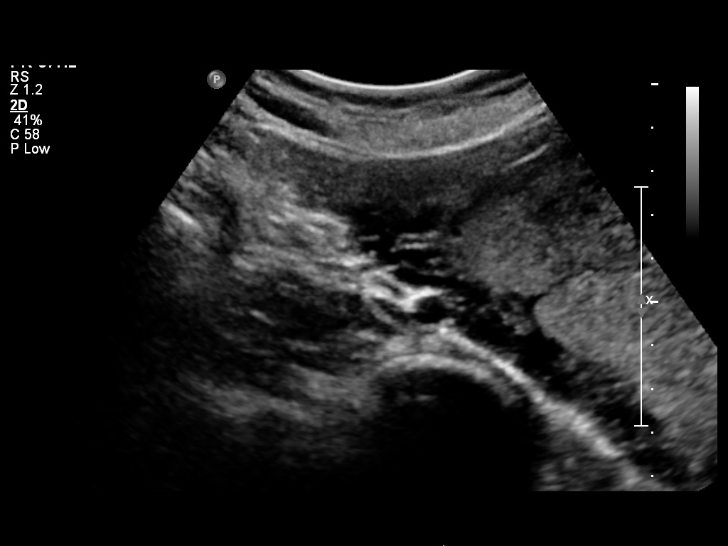
[im 53/65]
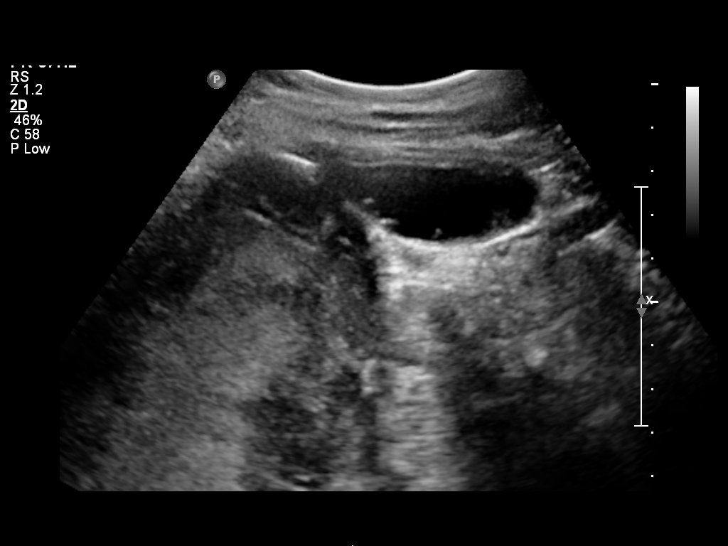
[im 57/65]
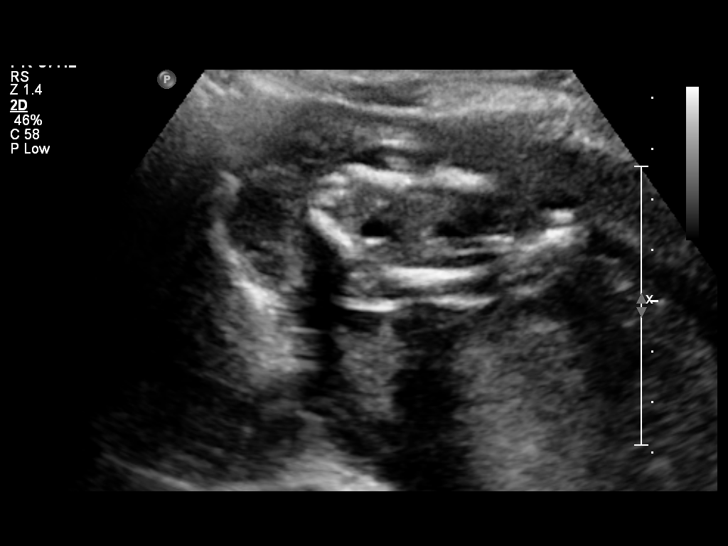
[im 62/65]
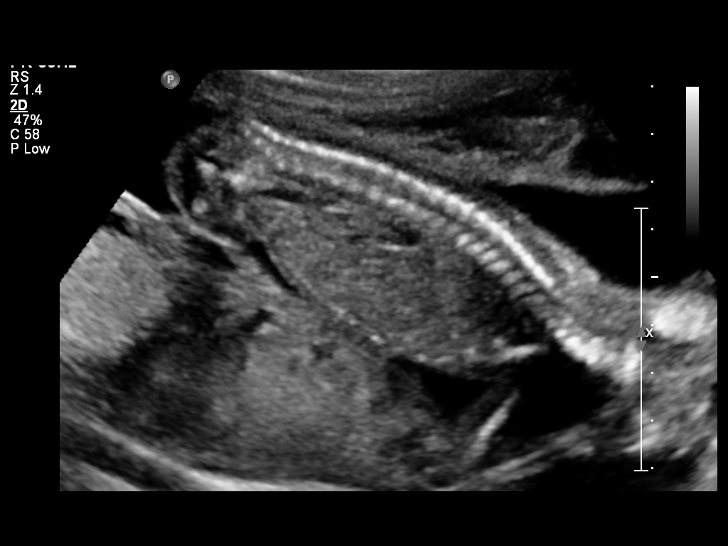

[12 of 28 positions shown; findings below may reference images not displayed]

OBSTETRICS REPORT
                      (Signed Final 06/04/2010 [DATE])

 Order#:         [id number]
Procedures

 US OB COMP + 14 WK                                    76805.1
Indications

 Basic anatomic survey
Fetal Evaluation

 Fetal Heart Rate:  147                          bpm
 Cardiac Activity:  Observed
 Presentation:      Cephalic
 Placenta:          Posterior, above cervical
                    os
 P. Cord            Visualized
 Insertion:

 Amniotic Fluid
 AFI FV:      Subjectively within normal limits
                                             Larg Pckt:     4.8  cm
Biometry

 BPD:     43.7  mm     G. Age:  19w 1d                CI:        73.15   70 - 86
                                                      FL/HC:      19.1   16.1 -

 HC:     162.4  mm     G. Age:  19w 0d       44  %    HC/AC:      1.17   1.09 -

 AC:     138.6  mm     G. Age:  19w 2d       55  %    FL/BPD:
 FL:        31  mm     G. Age:  19w 4d       65  %    FL/AC:      22.4   20 - 24
 NFT:     2.64  mm

 Est. FW:     290  gm    0 lb 10 oz      52  %
Gestational Age

 U/S Today:     19w 2d                                        EDD:   10/27/10
 Best:          19w 0d     Det. By:  U/S C R L (03/20/10)     EDD:   10/29/10
Anatomy

 Cranium:           Appears normal      Aortic Arch:       Basic anatomy
                                                           exam per order
 Fetal Cavum:       Appears normal      Ductal Arch:       Basic anatomy
                                                           exam per order
 Ventricles:        Appears normal      Diaphragm:         Appears normal
 Choroid Plexus:    Appears normal      Stomach:           Appears
                                                           normal, left
                                                           sided
 Cerebellum:        Appears normal      Abdomen:           Appears normal
 Posterior Fossa:   Appears normal      Abdominal Wall:    Appears nml
                                                           (cord insert,
                                                           abd wall)
 Nuchal Fold:       Appears normal      Cord Vessels:      Appears normal
                    (neck, nuchal                          (3 vessel cord)
                    fold)
 Face:              Lips appear         Kidneys:           Appear normal
                    normal (basic
                    anatomy exam)
 Heart:             Appears normal      Bladder:           Appears normal
                    (4 chamber &
                    axis)
 RVOT:              Basic anatomy       Spine:             Appears normal
                    exam per order
 LVOT:              Basic anatomy       Limbs:             Four extremities
                    exam per order                         visualized
                                                           (basic anatomy
                                                           exam)

 Other:     Female gender.
Cervix Uterus Adnexa

 Cervical Length:    3.2      cm

 Cervix:       Normal appearance by transabdominal scan.

 Left Ovary:    Simple cyst measuring. 3.1x8.7x3.9cm
 Right Ovary:   Previously seen
 Adnexa:     No abnormality visualized.
Impression

 Assigned GA is currently 19w 0d by prior US.  Appropriate
 interval fetal growth.
 No fetal anomalies seen involving visualized anatomy.
 Normal amniotic fluid volume. Normal cervical length.

 questions or concerns.

## 2012-12-10 IMAGING — US US OB FOLLOW-UP
1 series · 4 of 4 positions shown · non-contrast
Comparison: none

[Series 1: us ob follow up · 4 acquisitions, 4 frames shown]
[im 1/4]
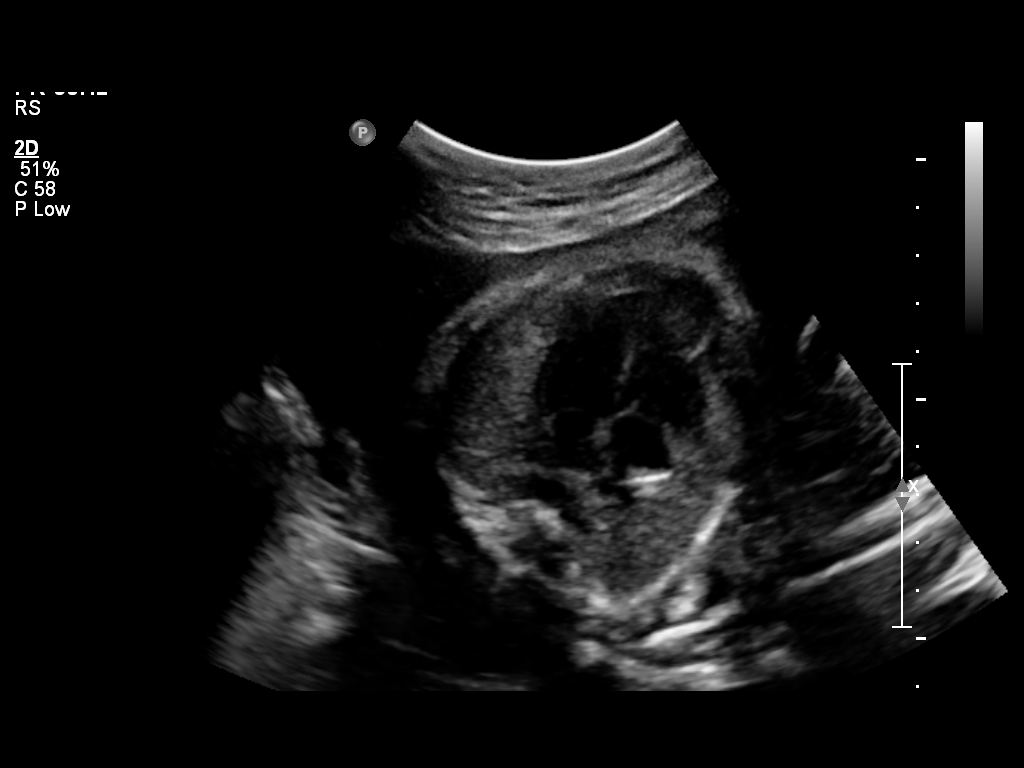
[im 2/4]
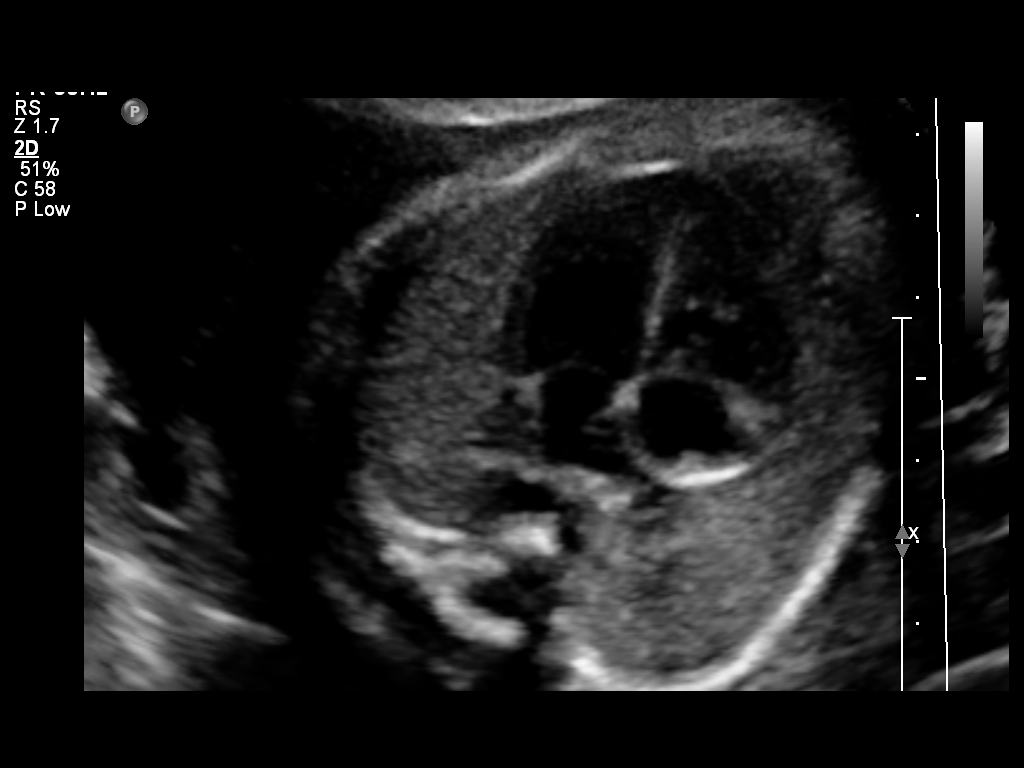
[im 3/4]
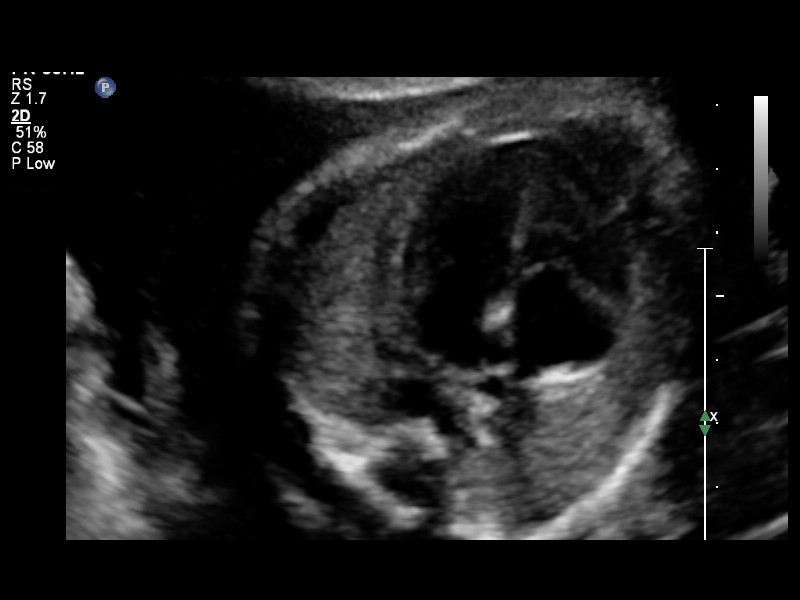
[im 4/4]
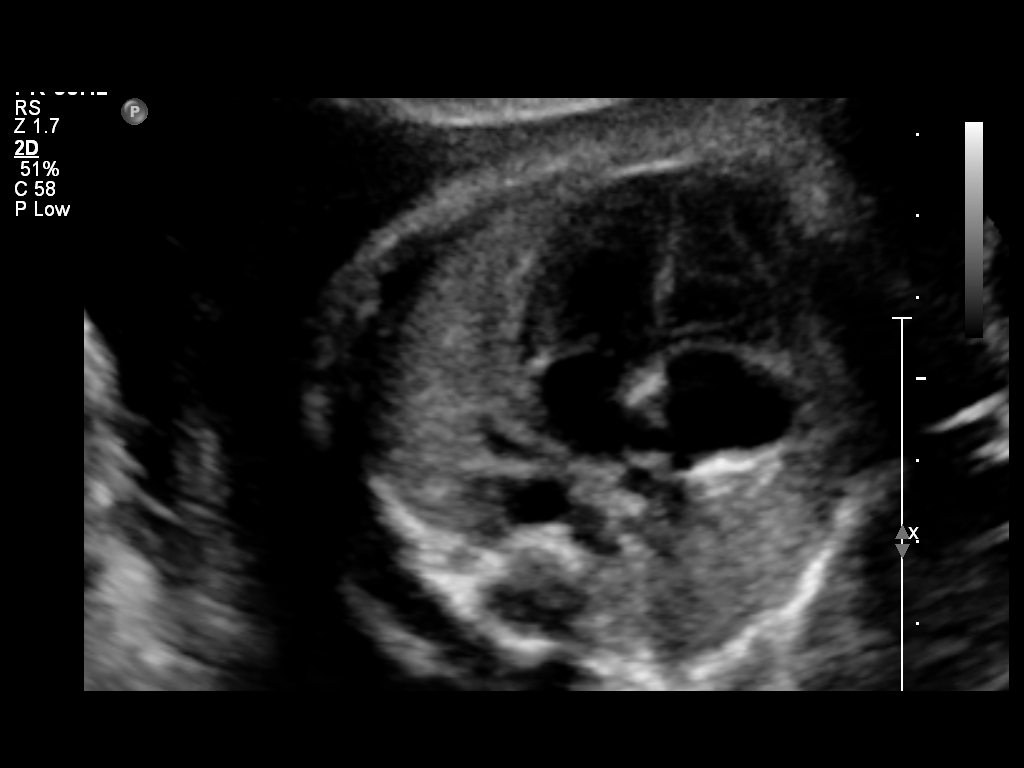

[4 of 4 positions shown; findings below may reference images not displayed]

OBSTETRICS REPORT
                      (Signed Final 08/21/2010 [DATE])

 Order#:         [id number]
Procedures

 US OB FOLLOW UP                                       76816.1
Indications

 Intrauterine Growth Restriction (R/O)
 Size less than dates (Small for gestational [AGE]
 FGR)
Fetal Evaluation

 Fetal Heart Rate:  147                          bpm
 Cardiac Activity:  Observed
 Presentation:      Cephalic
 Placenta:          Posterior, above cervical
                    os
 P. Cord            Visualized
 Insertion:

 Amniotic Fluid
 AFI FV:      Subjectively within normal limits
 AFI Sum:     16.4    cm       60  %Tile     Larg Pckt:     4.8  cm
 RUQ:   4.8     cm   RLQ:    3.6    cm    LUQ:   4.6     cm   LLQ:    3.4    cm
Biometry

 BPD:     75.4  mm     G. Age:  30w 2d                CI:        70.58   70 - 86
                                                      FL/HC:      21.3   19.2 -

 HC:     286.1  mm     G. Age:  31w 3d       52  %    HC/AC:      1.13   0.99 -

 AC:     253.8  mm     G. Age:  29w 4d       29  %    FL/BPD:     80.9   71 - 87
 FL:        61  mm     G. Age:  31w 5d       77  %    FL/AC:      24.0   20 - 24

 Est. FW:    6711  gm      3 lb 8 oz     60  %
Gestational Age

 U/S Today:     30w 5d                                        EDD:   10/25/10
 Best:          30w 1d     Det. By:  U/S C R L (03/20/10)     EDD:   10/29/10
Anatomy
 Cranium:           Appears normal      Aortic Arch:       Basic anatomy
                                                           exam per order
 Fetal Cavum:       Appears normal      Ductal Arch:       Basic anatomy
                                                           exam per order
 Ventricles:        Appears normal      Diaphragm:         Appears normal
 Choroid Plexus:    Previously seen     Stomach:           Appears
                                                           normal, left
                                                           sided
 Cerebellum:        Previously seen     Abdomen:           Previously seen
 Posterior Fossa:   Previously seen     Abdominal Wall:    Previously seen
 Nuchal Fold:       Previously seen     Cord Vessels:      Previously seen
 Face:              Lips previously     Kidneys:           Appear normal
                    seen
 Heart:             Appears normal      Bladder:           Appears normal
                    (4 chamber &
                    axis)
 RVOT:              Basic anatomy       Spine:             Previously seen
                    exam per order
 LVOT:              Basic anatomy       Limbs:             Previously seen
                    exam per order

 Other:     Fetus appears to be a female.
Cervix Uterus Adnexa

 Cervical Length:    3.2      cm

 Cervix:       Normal appearance by transabdominal scan.

 Left Ovary:    Not visualized.
 Right Ovary:   Within normal limits.
 Adnexa:     No abnormality visualized.
Impression

  Single living intrauterine gestation in cephalic presentation
 with concordant gestational age and fetal indices. The EFW
 today is at the 60th percentile. c Normal growth and fluid.

 questions or concerns.

## 2013-11-13 ENCOUNTER — Encounter (HOSPITAL_COMMUNITY): Payer: Self-pay | Admitting: Emergency Medicine

## 2014-01-10 ENCOUNTER — Inpatient Hospital Stay (HOSPITAL_COMMUNITY)
Admission: AD | Admit: 2014-01-10 | Discharge: 2014-01-10 | Disposition: A | Discharge status: Home / Self Care | Payer: Medicaid Other | Source: Ambulatory Visit | Attending: Obstetrics & Gynecology | Admitting: Obstetrics & Gynecology

## 2014-01-10 ENCOUNTER — Encounter (HOSPITAL_COMMUNITY): Payer: Self-pay

## 2014-01-10 DIAGNOSIS — O23592 Infection of other part of genital tract in pregnancy, second trimester: Secondary | ICD-10-CM | POA: Diagnosis not present

## 2014-01-10 DIAGNOSIS — R51 Headache: Secondary | ICD-10-CM | POA: Insufficient documentation

## 2014-01-10 DIAGNOSIS — Z3A15 15 weeks gestation of pregnancy: Secondary | ICD-10-CM | POA: Insufficient documentation

## 2014-01-10 DIAGNOSIS — N760 Acute vaginitis: Secondary | ICD-10-CM | POA: Diagnosis not present

## 2014-01-10 DIAGNOSIS — B9689 Other specified bacterial agents as the cause of diseases classified elsewhere: Secondary | ICD-10-CM | POA: Diagnosis not present

## 2014-01-10 DIAGNOSIS — A499 Bacterial infection, unspecified: Secondary | ICD-10-CM

## 2014-01-10 LAB — URINALYSIS, ROUTINE W REFLEX MICROSCOPIC
Bilirubin Urine: NEGATIVE
GLUCOSE, UA: NEGATIVE mg/dL
HGB URINE DIPSTICK: NEGATIVE
Ketones, ur: NEGATIVE mg/dL
Leukocytes, UA: NEGATIVE
Nitrite: NEGATIVE
PH: 6 (ref 5.0–8.0)
Protein, ur: NEGATIVE mg/dL
SPECIFIC GRAVITY, URINE: 1.02 (ref 1.005–1.030)
UROBILINOGEN UA: 0.2 mg/dL (ref 0.0–1.0)

## 2014-01-10 LAB — WET PREP, GENITAL
TRICH WET PREP: NONE SEEN
YEAST WET PREP: NONE SEEN

## 2014-01-10 MED ORDER — CYCLOBENZAPRINE HCL 10 MG PO TABS: 10.0000 mg | ORAL_TABLET | Freq: Every day | ORAL | Status: DC

## 2014-01-10 MED ORDER — CYCLOBENZAPRINE HCL 10 MG PO TABS
10.0000 mg | ORAL_TABLET | Freq: Once | ORAL | Status: AC
  Administered 2014-01-10: 10 mg via ORAL
  Filled 2014-01-10: qty 1

## 2014-01-10 MED ORDER — METRONIDAZOLE 500 MG PO TABS: 500.0000 mg | ORAL_TABLET | Freq: Two times a day (BID) | ORAL | Status: DC

## 2014-01-10 NOTE — Discharge Instructions (Signed)
Bacterial Vaginosis Bacterial vaginosis is a vaginal infection that occurs when the normal balance of bacteria in the vagina is disrupted. It results from an overgrowth of certain bacteria. This is the most common vaginal infection in women of childbearing age. Treatment is important to prevent complications, especially in pregnant women, as it can cause a premature delivery. CAUSES  Bacterial vaginosis is caused by an increase in harmful bacteria that are normally present in smaller amounts in the vagina. Several different kinds of bacteria can cause bacterial vaginosis. However, the reason that the condition develops is not fully understood. RISK FACTORS Certain activities or behaviors can put you at an increased risk of developing bacterial vaginosis, including:  Having a new sex partner or multiple sex partners.  Douching.  Using an intrauterine device (IUD) for contraception. Women do not get bacterial vaginosis from toilet seats, bedding, swimming pools, or contact with objects around them. SIGNS AND SYMPTOMS  Some women with bacterial vaginosis have no signs or symptoms. Common symptoms include:  Grey vaginal discharge.  A fishlike odor with discharge, especially after sexual intercourse.  Itching or burning of the vagina and vulva.  Burning or pain with urination. DIAGNOSIS  Your health care provider will take a medical history and examine the vagina for signs of bacterial vaginosis. A sample of vaginal fluid may be taken. Your health care provider will look at this sample under a microscope to check for bacteria and abnormal cells. A vaginal pH test may also be done.  TREATMENT  Bacterial vaginosis may be treated with antibiotic medicines. These may be given in the form of a pill or a vaginal cream. A second round of antibiotics may be prescribed if the condition comes back after treatment.  HOME CARE INSTRUCTIONS   Only take over-the-counter or prescription medicines as  directed by your health care provider.  If antibiotic medicine was prescribed, take it as directed. Make sure you finish it even if you start to feel better.  Do not have sex until treatment is completed.  Tell all sexual partners that you have a vaginal infection. They should see their health care provider and be treated if they have problems, such as a mild rash or itching.  Practice safe sex by using condoms and only having one sex partner. SEEK MEDICAL CARE IF:   Your symptoms are not improving after 3 days of treatment.  You have increased discharge or pain.  You have a fever. MAKE SURE YOU:   Understand these instructions.  Will watch your condition.  Will get help right away if you are not doing well or get worse. FOR MORE INFORMATION  Centers for Disease Control and Prevention, Division of STD Prevention: www.cdc.gov/std American Sexual Health Association (ASHA): www.ashastd.org  Document Released: 12/29/2004 Document Revised: 10/19/2012 Document Reviewed: 08/10/2012 ExitCare Patient Information 2015 [employer]. This information is not intended to replace advice given to you by your health care provider. Make sure you discuss any questions you have with your health care provider.  

## 2014-01-10 NOTE — MAU Provider Note (Signed)
History     CSN: [id number]  Arrival date and time: 01/10/14 1445   First Provider Initiated Contact with Patient 01/10/14 1739      Chief Complaint  Patient presents with  . Possible Pregnancy  . Nausea  . Headache  . Vaginal Discharge   HPI  Pt is a 23 yo G4P2012 at 4580w3d wks IUP here with report of headache x 3 days.  Headaches is described as a sharp pain behind eyes and rated 8/10.  +nausea, negative vomiting.  +photophobia.    No prenatal care to date.  Denies vaginal bleeding or abdominal pain.  +white vaginal discharge with odor.  Negative vaginal itching.  [facility].    Past Medical History  Diagnosis Date  . Headache(784.0)     freq during allergy season    Past Surgical History  Procedure Laterality Date  . Induced abortion      Family History  Problem Relation Age of Onset  . Kidney disease Mother     History  Substance Use Topics  . Smoking status: Never Smoker   . Smokeless tobacco: Never Used  . Alcohol Use: No    Allergies: No Known Allergies  Facility-administered medications prior to admission  Medication Dose Route Frequency Provider Last Rate Last Dose  . medroxyPROGESTERone (DEPO-PROVERA) injection 150 mg  150 mg Intramuscular Once [employer] Chemicalvy de La Cruz, DO       Prescriptions prior to admission  Medication Sig Dispense Refill Last Dose  . acetaminophen (TYLENOL) 500 MG tablet Take 1,000 mg by mouth every 6 (six) hours as needed for pain.   01/09/2014 at 2200  . diazepam (VALIUM) 5 MG tablet Take 1 tablet (5 mg total) by mouth every 6 (six) hours as needed (muscle spasms). (Patient not taking: Reported on 01/10/2014) 10 tablet 0 Not Taking at Unknown time    Review of Systems  Eyes: Positive for photophobia. Negative for blurred vision and double vision.  Gastrointestinal: Positive for nausea. Negative for vomiting and abdominal pain.  Genitourinary:       Vaginal discharge  Neurological: Positive for  headaches.  All other systems reviewed and are negative.  Physical Exam   Blood pressure 113/62, pulse 91, temperature 99 F (37.2 C), temperature source Oral, resp. rate 16, height 5\' 4"  (1.626 m), weight 62.869 kg (138 lb 9.6 oz), last menstrual period 09/24/2013, SpO2 100 %, unknown if currently breastfeeding.  Physical Exam  Constitutional: She is oriented to person, place, and time. She appears well-developed and well-nourished.  HENT:  Head: Normocephalic.  Eyes: EOM are normal. Pupils are equal, round, and reactive to light.  Neck: Normal range of motion. Neck supple.  Cardiovascular: Normal rate, regular rhythm and normal heart sounds.   Respiratory: Effort normal and breath sounds normal.  Genitourinary: No bleeding in the vagina.  Neurological: She is alert and oriented to person, place, and time.  Skin: Skin is warm and dry.    MAU Course  Procedures  Results for orders placed or performed during the hospital encounter of 01/10/14 (from the past 24 hour(s))  Urinalysis, Routine w reflex microscopic     Status: None   Collection Time: 01/10/14  3:16 PM  Result Value Ref Range   Color, Urine YELLOW YELLOW   APPearance CLEAR CLEAR   Specific Gravity, Urine 1.020 1.005 - 1.030   pH 6.0 5.0 - 8.0   Glucose, UA NEGATIVE NEGATIVE mg/dL   Hgb urine dipstick NEGATIVE NEGATIVE  Bilirubin Urine NEGATIVE NEGATIVE   Ketones, ur NEGATIVE NEGATIVE mg/dL   Protein, ur NEGATIVE NEGATIVE mg/dL   Urobilinogen, UA 0.2 0.0 - 1.0 mg/dL   Nitrite NEGATIVE NEGATIVE   Leukocytes, UA NEGATIVE NEGATIVE  Wet prep, genital     Status: Abnormal   Collection Time: 01/10/14  5:58 PM  Result Value Ref Range   Yeast Wet Prep HPF POC NONE SEEN NONE SEEN   Trich, Wet Prep NONE SEEN NONE SEEN   Clue Cells Wet Prep HPF POC FEW (A) NONE SEEN   WBC, Wet Prep HPF POC FEW (A) NONE SEEN   Flexeril 10 mg PO > pt reports relief after receiving meds  Assessment and Plan  23 yo [id number] at 7085w3d wks  IUP Bacterial Vaginosis Headaches in Pregnancy  Plan: Discharge to home RX Flexeril 10 mg PRN headache Flagyl 500 mg BID x 7 days Schedule prenatal appointment  Rochele PagesKARIM, Orlandria Kissner N 01/10/2014, 5:43 PM

## 2014-01-10 NOTE — MAU Note (Signed)
Pt states she has been having a headache for about  2wks, tylenol takes the edge off, headache doesn;'t go away completely.\

## 2014-01-10 NOTE — MAU Note (Signed)
Patient state she has [facility]. States she has had headaches off and on for a couple of weeks. Nausea, no vomiting. Slight white vaginal discharge. Denies bleeding or abdominal pain.

## 2014-01-12 NOTE — L&D Delivery Note (Signed)
Patient was C/C/+4 and pushed for 2 minutes with epidural.   NSVD  female infant, Apgars 9,9, weight P.   The patient had two vaginal  Lacerations at 8 and 5 o'clock repaired with 2-0 vicryl r. Fundus was firm. EBL was expected amount. Placenta was delivered intact. Vagina was clear.  Baby was vigorous and doing skin to skin with mother.  Maddelyn Rocca A

## 2014-01-29 LAB — OB RESULTS CONSOLE ABO/RH: RH Type: POSITIVE

## 2014-01-29 LAB — OB RESULTS CONSOLE RPR: RPR: NONREACTIVE

## 2014-01-29 LAB — OB RESULTS CONSOLE ANTIBODY SCREEN: ANTIBODY SCREEN: NEGATIVE

## 2014-01-29 LAB — OB RESULTS CONSOLE HEPATITIS B SURFACE ANTIGEN: HEP B S AG: NEGATIVE

## 2014-01-29 LAB — OB RESULTS CONSOLE GC/CHLAMYDIA
CHLAMYDIA, DNA PROBE: NEGATIVE
GC PROBE AMP, GENITAL: NEGATIVE

## 2014-01-29 LAB — OB RESULTS CONSOLE RUBELLA ANTIBODY, IGM: RUBELLA: IMMUNE

## 2014-01-29 LAB — OB RESULTS CONSOLE HIV ANTIBODY (ROUTINE TESTING): HIV: NONREACTIVE

## 2014-05-29 ENCOUNTER — Encounter (HOSPITAL_COMMUNITY): Payer: Self-pay | Admitting: *Deleted

## 2014-05-29 ENCOUNTER — Inpatient Hospital Stay (HOSPITAL_COMMUNITY)
Admission: AD | Admit: 2014-05-29 | Discharge: 2014-05-29 | Disposition: A | Discharge status: Home / Self Care | Payer: Medicaid Other | Source: Ambulatory Visit | Attending: Obstetrics | Admitting: Obstetrics

## 2014-05-29 DIAGNOSIS — O9989 Other specified diseases and conditions complicating pregnancy, childbirth and the puerperium: Secondary | ICD-10-CM | POA: Diagnosis present

## 2014-05-29 DIAGNOSIS — N898 Other specified noninflammatory disorders of vagina: Secondary | ICD-10-CM | POA: Diagnosis not present

## 2014-05-29 DIAGNOSIS — Z3A35 35 weeks gestation of pregnancy: Secondary | ICD-10-CM | POA: Insufficient documentation

## 2014-05-29 DIAGNOSIS — O26893 Other specified pregnancy related conditions, third trimester: Secondary | ICD-10-CM

## 2014-05-29 HISTORY — DX: Unspecified hemorrhoids: K64.9

## 2014-05-29 LAB — WET PREP, GENITAL
Clue Cells Wet Prep HPF POC: NONE SEEN
Trich, Wet Prep: NONE SEEN
Yeast Wet Prep HPF POC: NONE SEEN

## 2014-05-29 NOTE — Discharge Instructions (Signed)
Braxton Hicks Contractions °Contractions of the uterus can occur throughout pregnancy. Contractions are not always a sign that you are in labor.  °WHAT ARE BRAXTON HICKS CONTRACTIONS?  °Contractions that occur before labor are called Braxton Hicks contractions, or false labor. Toward the end of pregnancy (32-34 weeks), these contractions can develop more often and may become more forceful. This is not true labor because these contractions do not result in opening (dilatation) and thinning of the cervix. They are sometimes difficult to tell apart from true labor because these contractions can be forceful and people have different pain tolerances. You should not feel embarrassed if you go to the hospital with false labor. Sometimes, the only way to tell if you are in true labor is for your health care provider to look for changes in the cervix. °If there are no prenatal problems or other health problems associated with the pregnancy, it is completely safe to be sent home with false labor and await the onset of true labor. °HOW CAN YOU TELL THE DIFFERENCE BETWEEN TRUE AND FALSE LABOR? °False Labor °· The contractions of false labor are usually shorter and not as hard as those of true labor.   °· The contractions are usually irregular.   °· The contractions are often felt in the front of the lower abdomen and in the groin.   °· The contractions may go away when you walk around or change positions while lying down.   °· The contractions get weaker and are shorter lasting as time goes on.   °· The contractions do not usually become progressively stronger, regular, and closer together as with true labor.   °True Labor °1. Contractions in true labor last 30-70 seconds, become very regular, usually become more intense, and increase in frequency.   °2. The contractions do not go away with walking.   °3. The discomfort is usually felt in the top of the uterus and spreads to the lower abdomen and low back.   °4. True labor can  be determined by your health care provider with an exam. This will show that the cervix is dilating and getting thinner.   °WHAT TO REMEMBER °· Keep up with your usual exercises and follow other instructions given by your health care provider.   °· Take medicines as directed by your health care provider.   °· Keep your regular prenatal appointments.   °· Eat and drink lightly if you think you are going into labor.   °· If Braxton Hicks contractions are making you uncomfortable:   °· Change your position from lying down or resting to walking, or from walking to resting.   °· Sit and rest in a tub of warm water.   °· Drink 2-3 glasses of water. Dehydration may cause these contractions.   °· Do slow and deep breathing several times an hour.   °WHEN SHOULD I SEEK IMMEDIATE MEDICAL CARE? °Seek immediate medical care if: °· Your contractions become stronger, more regular, and closer together.   °· You have fluid leaking or gushing from your vagina.   °· You have a fever.   °· You pass blood-tinged mucus.   °· You have vaginal bleeding.   °· You have continuous abdominal pain.   °· You have low back pain that you never had before.   °· You feel your baby's head pushing down and causing pelvic pressure.   °· Your baby is not moving as much as it used to.   °Document Released: 12/29/2004 Document Revised: 01/03/2013 Document Reviewed: 10/10/2012 °ExitCare® Patient Information ©2015 [employer]. This information is not intended to replace advice given to you by your health care   provider. Make sure you discuss any questions you have with your health care provider. ° °Fetal Movement Counts °Patient Name: __________________________________________________ Patient Due Date: ____________________ °Performing a fetal movement count is highly recommended in high-risk pregnancies, but it is good for every pregnant woman to do. Your health care provider may ask you to start counting fetal movements at 28 weeks of the pregnancy. Fetal  movements often increase: °· After eating a full meal. °· After physical activity. °· After eating or drinking something sweet or cold. °· At rest. °Pay attention to when you feel the baby is most active. This will help you notice a pattern of your baby's sleep and wake cycles and what factors contribute to an increase in fetal movement. It is important to perform a fetal movement count at the same time each day when your baby is normally most active.  °HOW TO COUNT FETAL MOVEMENTS °5. Find a quiet and comfortable area to sit or lie down on your left side. Lying on your left side provides the best blood and oxygen circulation to your baby. °6. Write down the day and time on a sheet of paper or in a journal. °7. Start counting kicks, flutters, swishes, rolls, or jabs in a 2-hour period. You should feel at least 10 movements within 2 hours. °8. If you do not feel 10 movements in 2 hours, wait 2-3 hours and count again. Look for a change in the pattern or not enough counts in 2 hours. °SEEK MEDICAL CARE IF: °· You feel less than 10 counts in 2 hours, tried twice. °· There is no movement in over an hour. °· The pattern is changing or taking longer each day to reach 10 counts in 2 hours. °· You feel the baby is not moving as he or she usually does. °Date: ____________ Movements: ____________ Start time: ____________ Finish time: ____________  °Date: ____________ Movements: ____________ Start time: ____________ Finish time: ____________ °Date: ____________ Movements: ____________ Start time: ____________ Finish time: ____________ °Date: ____________ Movements: ____________ Start time: ____________ Finish time: ____________ °Date: ____________ Movements: ____________ Start time: ____________ Finish time: ____________ °Date: ____________ Movements: ____________ Start time: ____________ Finish time: ____________ °Date: ____________ Movements: ____________ Start time: ____________ Finish time: ____________ °Date: ____________  Movements: ____________ Start time: ____________ Finish time: ____________  °Date: ____________ Movements: ____________ Start time: ____________ Finish time: ____________ °Date: ____________ Movements: ____________ Start time: ____________ Finish time: ____________ °Date: ____________ Movements: ____________ Start time: ____________ Finish time: ____________ °Date: ____________ Movements: ____________ Start time: ____________ Finish time: ____________ °Date: ____________ Movements: ____________ Start time: ____________ Finish time: ____________ °Date: ____________ Movements: ____________ Start time: ____________ Finish time: ____________ °Date: ____________ Movements: ____________ Start time: ____________ Finish time: ____________  °Date: ____________ Movements: ____________ Start time: ____________ Finish time: ____________ °Date: ____________ Movements: ____________ Start time: ____________ Finish time: ____________ °Date: ____________ Movements: ____________ Start time: ____________ Finish time: ____________ °Date: ____________ Movements: ____________ Start time: ____________ Finish time: ____________ °Date: ____________ Movements: ____________ Start time: ____________ Finish time: ____________ °Date: ____________ Movements: ____________ Start time: ____________ Finish time: ____________ °Date: ____________ Movements: ____________ Start time: ____________ Finish time: ____________  °Date: ____________ Movements: ____________ Start time: ____________ Finish time: ____________ °Date: ____________ Movements: ____________ Start time: ____________ Finish time: ____________ °Date: ____________ Movements: ____________ Start time: ____________ Finish time: ____________ °Date: ____________ Movements: ____________ Start time: ____________ Finish time: ____________ °Date: ____________ Movements: ____________ Start time: ____________ Finish time: ____________ °Date: ____________ Movements: ____________ Start time:  ____________ Finish time: ____________ °Date: ____________ Movements:   ____________ Start time: ____________ Finish time: ____________  °Date: ____________ Movements: ____________ Start time: ____________ Finish time: ____________ °Date: ____________ Movements: ____________ Start time: ____________ Finish time: ____________ °Date: ____________ Movements: ____________ Start time: ____________ Finish time: ____________ °Date: ____________ Movements: ____________ Start time: ____________ Finish time: ____________ °Date: ____________ Movements: ____________ Start time: ____________ Finish time: ____________ °Date: ____________ Movements: ____________ Start time: ____________ Finish time: ____________ °Date: ____________ Movements: ____________ Start time: ____________ Finish time: ____________  °Date: ____________ Movements: ____________ Start time: ____________ Finish time: ____________ °Date: ____________ Movements: ____________ Start time: ____________ Finish time: ____________ °Date: ____________ Movements: ____________ Start time: ____________ Finish time: ____________ °Date: ____________ Movements: ____________ Start time: ____________ Finish time: ____________ °Date: ____________ Movements: ____________ Start time: ____________ Finish time: ____________ °Date: ____________ Movements: ____________ Start time: ____________ Finish time: ____________ °Date: ____________ Movements: ____________ Start time: ____________ Finish time: ____________  °Date: ____________ Movements: ____________ Start time: ____________ Finish time: ____________ °Date: ____________ Movements: ____________ Start time: ____________ Finish time: ____________ °Date: ____________ Movements: ____________ Start time: ____________ Finish time: ____________ °Date: ____________ Movements: ____________ Start time: ____________ Finish time: ____________ °Date: ____________ Movements: ____________ Start time: ____________ Finish time: ____________ °Date:  ____________ Movements: ____________ Start time: ____________ Finish time: ____________ °Date: ____________ Movements: ____________ Start time: ____________ Finish time: ____________  °Date: ____________ Movements: ____________ Start time: ____________ Finish time: ____________ °Date: ____________ Movements: ____________ Start time: ____________ Finish time: ____________ °Date: ____________ Movements: ____________ Start time: ____________ Finish time: ____________ °Date: ____________ Movements: ____________ Start time: ____________ Finish time: ____________ °Date: ____________ Movements: ____________ Start time: ____________ Finish time: ____________ °Date: ____________ Movements: ____________ Start time: ____________ Finish time: ____________ °Document Released: 01/28/2006 Document Revised: 05/15/2013 Document Reviewed: 10/26/2011 °Exit[employer]® Patient Information ©2015 [employer]. This information is not intended to replace advice given to you by your health care provider. Make sure you discuss any questions you have with your health care provider. ° °

## 2014-05-29 NOTE — MAU Provider Note (Signed)
Chief Complaint:  No chief complaint on file.   First Provider Initiated Contact with Patient 05/29/14 1052     HPI: Abigail Phelps is a 24 y.o. [phone] at 7169w2d who presents to maternity admissions reporting leaking small amount of clear fluid since the evening of 05/27/2014. Enough fluid to make a spot on her underwear but not leak through her clothing. Denies contractions, vaginal bleeding, vaginal itching or vaginal odor. Has had a small amount of bleeding from hemorrhoids, but is certain that it is not from her vagina. Good fetal movement.   Past Medical History: Past Medical History  Diagnosis Date  . Headache(784.0)     freq during allergy season  . Hemorrhoids     Past obstetric history: OB History  Gravida Para Term Preterm AB SAB TAB Ectopic Multiple Living  4 2 2  0 1    0 2    # Outcome Date GA Lbr Len/2nd Weight Sex Delivery Anes PTL Lv  4 Current           3 Term 10/20/10 1352w5d 52:45 / 00:24 6 lb 9.5 oz (2.99 kg) F Vag-Spont EPI  Y     Comments: wnl  2 AB     U  None N N     Comments: 8 weeks  1 Term    7 lb 1.6 oz (3.221 kg) M Vag-Spont EPI N Y      Past Surgical History: Past Surgical History  Procedure Laterality Date  . Induced abortion       Family History: Family History  Problem Relation Age of Onset  . Kidney disease Mother     Social History: History  Substance Use Topics  . Smoking status: Never Smoker   . Smokeless tobacco: Never Used  . Alcohol Use: No    Allergies: No Known Allergies  Meds:  Prescriptions prior to admission  Medication Sig Dispense Refill Last Dose  . acetaminophen (TYLENOL) 500 MG tablet Take 1,000 mg by mouth every 6 (six) hours as needed for pain.   01/09/2014 at 2200  . cyclobenzaprine (FLEXERIL) 10 MG tablet Take 1 tablet (10 mg total) by mouth at bedtime. 30 tablet 2   . metroNIDAZOLE (FLAGYL) 500 MG tablet Take 1 tablet (500 mg total) by mouth 2 (two) times daily. 14 tablet 0     ROS:  Review of Systems   Constitutional: Negative for fever and chills.  Gastrointestinal: Negative for abdominal pain.  Genitourinary:       Positive for leaking fluid versus vaginal discharge. Negative for vaginal bleeding.    Physical Exam  Blood pressure 103/63, pulse 90, temperature 98.6 F (37 C), temperature source Oral, height 5\' 4"  (1.626 m), weight 153 lb 6 oz (69.57 kg), last menstrual period 09/24/2013, unknown if currently breastfeeding. GENERAL: Well-developed, well-nourished female in no acute distress.  HEART: normal rate RESP: normal effort GI: Abd soft, non-tender, gravid appropriate for gestational age. MS: Extremities nontender, no edema, normal ROM NEURO: Alert and oriented x 4.  GU: NEFG, moderate amount of clear mucus. Negative pooling, no blood, cervix clean. No CVAT   cervix long and closed.  FHT:  Baseline 145 , moderate variability, accelerations present, no decelerations Contractions: UI   Labs: Results for orders placed or performed during the hospital encounter of 05/29/14 (from the past 24 hour(s))  Wet prep, genital     Status: Abnormal   Collection Time: 05/29/14 10:54 AM  Result Value Ref Range   Yeast Wet Prep HPF  POC NONE SEEN NONE SEEN   Trich, Wet Prep NONE SEEN NONE SEEN   Clue Cells Wet Prep HPF POC NONE SEEN NONE SEEN   WBC, Wet Prep HPF POC FEW (A) NONE SEEN   Fern negative.  Imaging:  No results found.  MAU Course: Sterile speculum exam, wet prep, fern.  Assessment: 1. Vaginal discharge during pregnancy, antepartum, third trimester     Plan: Discharge home in stable condition.  Labor precautions and fetal kick counts Follow-up Information    [facility].   Specialty:  Obstetrics   Why:  As scheduled for routine prenatal care or sooner as needed if symptoms worsen   Contact information:   [address] [place] [address] [phone]       [facility].   Why:  As needed in emergencies   Contact information:   [address] [id number] mc [place] [id number] [phone]          Follow-up Information    [facility]/2014.   Specialty:  Obstetrics   Why:  As scheduled for routine prenatal care or sooner as needed if symptoms worsen   Contact information:   [address] [place] [address] [phone]       [facility].   Why:  As needed in emergencies   Contact information:   [address] [id number] mc [place] [id number] [phone]        Medication List    STOP taking these medications        metroNIDAZOLE 500 MG tablet  Commonly known as:  FLAGYL      TAKE these medications        acetaminophen 500 MG tablet  Commonly known as:  TYLENOL  Take 1,000 mg by mouth every 6 (six) hours as needed for pain.     cyclobenzaprine 10 MG tablet  Commonly known as:  FLEXERIL  Take 1 tablet (10 mg total) by mouth at bedtime.        DoloresVirginia Garrin Kirwan, CNM 05/29/2014 12:01 PM

## 2014-05-29 NOTE — MAU Note (Signed)
PT SAYS  SHE HAS HAD WHITE  D/C -   BUT NOTICED  ON Sunday  NIGHT  SHE   SAW A WET SPOT IN UNDERWEAR-  NO COLOR- NO ODOR    AND  HAS CONTINUED.    [facility]-  [facility] [place].     DENIES HSV AND MRSA.   A;SO HAS A HEMORRHOIDS   -  CALLED OFFICE  YESTERDAY   -  TOLD  TO USE PREPARATION  H -   SESS SPECKS  OF BLOOD,      IS CONSTIPATED  - TAKING  IRON

## 2014-06-01 ENCOUNTER — Inpatient Hospital Stay (HOSPITAL_COMMUNITY)
Admission: AD | Admit: 2014-06-01 | Discharge: 2014-06-02 | Disposition: A | Discharge status: Home / Self Care | Payer: Medicaid Other | Source: Ambulatory Visit | Attending: Obstetrics and Gynecology | Admitting: Obstetrics and Gynecology

## 2014-06-01 DIAGNOSIS — O2243 Hemorrhoids in pregnancy, third trimester: Secondary | ICD-10-CM

## 2014-06-01 DIAGNOSIS — Z3A35 35 weeks gestation of pregnancy: Secondary | ICD-10-CM | POA: Insufficient documentation

## 2014-06-01 HISTORY — DX: Anemia, unspecified: D64.9

## 2014-06-01 NOTE — MAU Note (Signed)
Had BM tonight and toilet full of blood. Have a hemorrhoid. Some blood on panties.Having a lot of pain from hemorrhoids

## 2014-06-02 ENCOUNTER — Encounter (HOSPITAL_COMMUNITY): Payer: Self-pay

## 2014-06-02 DIAGNOSIS — O2243 Hemorrhoids in pregnancy, third trimester: Secondary | ICD-10-CM | POA: Diagnosis not present

## 2014-06-02 DIAGNOSIS — K625 Hemorrhage of anus and rectum: Secondary | ICD-10-CM | POA: Diagnosis present

## 2014-06-02 DIAGNOSIS — Z3A35 35 weeks gestation of pregnancy: Secondary | ICD-10-CM | POA: Diagnosis not present

## 2014-06-02 MED ORDER — HYDROCORTISONE 2.5 % RE CREA
1.0000 | TOPICAL_CREAM | Freq: Two times a day (BID) | RECTAL | Status: DC
Start: 2014-06-02 — End: 2014-07-11

## 2014-06-02 MED ORDER — HYDROCORTISONE 2.5 % RE CREA: 1.0000 "application " | TOPICAL_CREAM | Freq: Two times a day (BID) | RECTAL | Status: DC

## 2014-06-02 NOTE — MAU Provider Note (Signed)
  History     CSN: [id number]  Arrival date and time: 06/01/14 2224   First Provider Initiated Contact with Patient 06/02/14 0133      Chief Complaint  Patient presents with  . Rectal Bleeding   HPI Abigail Rogueiera N Phelps 24 y.o. [id number] @[redacted]w[redacted]d  presents to MAU complaining of bleeding hemorrhoids.   5 days ago, she had light spots.  This evening is increased.  The water in the toilet bowl is dark red and underclothes are soaked.  It is continuing to bleed some.  She has pain in her bottom.  She denies contractions and baby is moving well.  No LOF, dysuria, fever, HA, weakness.   OB History    Gravida Para Term Preterm AB TAB SAB Ectopic Multiple Living   4 2 2  0 1    0 2      Past Medical History  Diagnosis Date  . Headache(784.0)     freq during allergy season  . Hemorrhoids   . Anemia     Past Surgical History  Procedure Laterality Date  . Induced abortion      Family History  Problem Relation Age of Onset  . Kidney disease Mother     History  Substance Use Topics  . Smoking status: Never Smoker   . Smokeless tobacco: Never Used  . Alcohol Use: No    Allergies: No Known Allergies  Prescriptions prior to admission  Medication Sig Dispense Refill Last Dose  . OVER THE COUNTER MEDICATION Stool softner     . acetaminophen (TYLENOL) 500 MG tablet Take 1,000 mg by mouth every 6 (six) hours as needed for pain.   01/09/2014 at 2200  . cyclobenzaprine (FLEXERIL) 10 MG tablet Take 1 tablet (10 mg total) by mouth at bedtime. 30 tablet 2     ROS Pertinent ROS in HPI.  All other systems are negative.   Physical Exam   Blood pressure 119/58, pulse 86, temperature 98.4 F (36.9 C), resp. rate 18, height 5\' 4"  (1.626 m), weight 154 lb 12.8 oz (70.217 kg), last menstrual period 09/24/2013, unknown if currently breastfeeding.  Physical Exam  Constitutional: She is oriented to person, place, and time. She appears well-developed and well-nourished. No distress.  HENT:  Head:  Normocephalic and atraumatic.  Eyes: EOM are normal.  Neck: Normal range of motion.  Cardiovascular: Normal rate, regular rhythm and normal heart sounds.   Respiratory: Effort normal and breath sounds normal. No respiratory distress.  GI: Soft. Bowel sounds are normal. She exhibits no distension.  Genitourinary:  Large external hemorrhoid with oozing of blood Cervix is soft, closed  Neurological: She is alert and oriented to person, place, and time.  Skin: Skin is warm and dry.  Psychiatric: She has a normal mood and affect.   Fetal Tracing: Baseline:135 Variability:mod Accelerations: 15x15 Decelerations:none Toco:irritability    MAU Course  Procedures  MDM Discussed with Dr. Claiborne Billingsallahan.  She is in agreement for pt discharge with rx for anusol.  Pt already has f/u arranged in office  Assessment and Plan  A:  1. Hemorrhoids in pregnancy in third trimester    P: Discharge to home Rx: anusol Diet discussed F/u in clinic as scheduled Patient may return to MAU as needed or if her condition were to change or worsen   Bertram Denvereague Clark, Karen E 06/02/2014, 1:34 AM

## 2014-06-02 NOTE — Discharge Instructions (Signed)
Hemorrhoids Hemorrhoids are puffy (swollen) veins around the rectum or anus. Hemorrhoids can cause pain, itching, bleeding, or irritation. HOME CARE  Eat foods with fiber, such as whole grains, beans, nuts, fruits, and vegetables. Ask your doctor about taking products with added fiber in them (fibersupplements).  Drink enough fluid to keep your pee (urine) clear or pale yellow.  Exercise often.  Go to the bathroom when you have the urge to poop. Do not wait.  Avoid straining to poop (bowel movement).  Keep the butt area dry and clean. Use wet toilet paper or moist paper towels.  Medicated creams and medicine inserted into the anus (anal suppository) may be used or applied as told.  Only take medicine as told by your doctor.  Take a warm water bath (sitz bath) for 15-20 minutes to ease pain. Do this 3-4 times a day.  Place ice packs on the area if it is tender or puffy. Use the ice packs between the warm water baths.  Put ice in a plastic bag.  Place a towel between your skin and the bag.  Leave the ice on for 15-20 minutes, 03-04 times a day.  Do not use a donut-shaped pillow or sit on the toilet for a long time. GET HELP RIGHT AWAY IF:   You have more pain that is not controlled by treatment or medicine.  You have bleeding that will not stop.  You have trouble or are unable to poop (bowel movement).  You have pain or puffiness outside the area of the hemorrhoids. MAKE SURE YOU:   Understand these instructions.  Will watch your condition.  Will get help right away if you are not doing well or get worse. Document Released: 10/08/2007 Document Revised: 12/16/2011 Document Reviewed: 11/10/2011 ExitCare Patient Information 2015 [employer]. This information is not intended to replace advice given to you by your health care provider. Make sure you discuss any questions you have with your health care provider.  

## 2014-06-07 ENCOUNTER — Other Ambulatory Visit: Payer: Self-pay | Admitting: Obstetrics and Gynecology

## 2014-06-07 LAB — OB RESULTS CONSOLE GBS: STREP GROUP B AG: NEGATIVE

## 2014-07-10 ENCOUNTER — Encounter (HOSPITAL_COMMUNITY): Payer: Self-pay | Admitting: *Deleted

## 2014-07-10 ENCOUNTER — Telehealth (HOSPITAL_COMMUNITY): Payer: Self-pay | Admitting: *Deleted

## 2014-07-10 NOTE — Telephone Encounter (Signed)
Preadmission screen  

## 2014-07-11 ENCOUNTER — Encounter (HOSPITAL_COMMUNITY): Payer: Self-pay

## 2014-07-11 ENCOUNTER — Inpatient Hospital Stay (HOSPITAL_COMMUNITY)
Admission: AD | Admit: 2014-07-11 | Discharge: 2014-07-12 | DRG: 775 | Disposition: A | Discharge status: Home / Self Care | Payer: Medicaid Other | Source: Ambulatory Visit | Attending: Obstetrics and Gynecology | Admitting: Obstetrics and Gynecology

## 2014-07-11 DIAGNOSIS — Z3A40 40 weeks gestation of pregnancy: Secondary | ICD-10-CM | POA: Diagnosis present

## 2014-07-11 LAB — CBC
HEMATOCRIT: 32.3 % — AB (ref 36.0–46.0)
HEMOGLOBIN: 10.5 g/dL — AB (ref 12.0–15.0)
MCH: 24.2 pg — ABNORMAL LOW (ref 26.0–34.0)
MCHC: 32.5 g/dL (ref 30.0–36.0)
MCV: 74.6 fL — ABNORMAL LOW (ref 78.0–100.0)
Platelets: 197 10*3/uL (ref 150–400)
RBC: 4.33 MIL/uL (ref 3.87–5.11)
RDW: 14.4 % (ref 11.5–15.5)
WBC: 11.8 10*3/uL — ABNORMAL HIGH (ref 4.0–10.5)

## 2014-07-11 LAB — RPR: RPR Ser Ql: NONREACTIVE

## 2014-07-11 LAB — TYPE AND SCREEN
ABO/RH(D): O POS
Antibody Screen: NEGATIVE

## 2014-07-11 MED ORDER — ZOLPIDEM TARTRATE 5 MG PO TABS: 5.0000 mg | ORAL_TABLET | Freq: Every evening | ORAL | Status: DC | PRN

## 2014-07-11 MED ORDER — WITCH HAZEL-GLYCERIN EX PADS
1.0000 "application " | MEDICATED_PAD | CUTANEOUS | Status: DC | PRN
  Administered 2014-07-11: 1 via TOPICAL

## 2014-07-11 MED ORDER — MAGNESIUM HYDROXIDE 400 MG/5ML PO SUSP: 30.0000 mL | ORAL | Status: DC | PRN

## 2014-07-11 MED ORDER — ACETAMINOPHEN 325 MG PO TABS: 650.0000 mg | ORAL_TABLET | ORAL | Status: DC | PRN

## 2014-07-11 MED ORDER — SODIUM CHLORIDE 0.9 % IJ SOLN: 3.0000 mL | INTRAMUSCULAR | Status: DC | PRN

## 2014-07-11 MED ORDER — LACTATED RINGERS IV SOLN: 500.0000 mL | INTRAVENOUS | Status: DC | PRN

## 2014-07-11 MED ORDER — FERROUS SULFATE 325 (65 FE) MG PO TABS
325.0000 mg | ORAL_TABLET | Freq: Two times a day (BID) | ORAL | Status: DC
  Administered 2014-07-11 – 2014-07-12 (×2): 325 mg via ORAL
  Filled 2014-07-11 (×2): qty 1

## 2014-07-11 MED ORDER — FLEET ENEMA 7-19 GM/118ML RE ENEM: 1.0000 | ENEMA | RECTAL | Status: DC | PRN

## 2014-07-11 MED ORDER — DIPHENHYDRAMINE HCL 25 MG PO CAPS: 25.0000 mg | ORAL_CAPSULE | Freq: Four times a day (QID) | ORAL | Status: DC | PRN

## 2014-07-11 MED ORDER — METHYLERGONOVINE MALEATE 0.2 MG/ML IJ SOLN
INTRAMUSCULAR | Status: AC
  Administered 2014-07-11: 0.2 mg
  Filled 2014-07-11: qty 1

## 2014-07-11 MED ORDER — SODIUM CHLORIDE 0.9 % IJ SOLN
3.0000 mL | Freq: Two times a day (BID) | INTRAMUSCULAR | Status: DC
  Administered 2014-07-12: 3 mL via INTRAVENOUS

## 2014-07-11 MED ORDER — ONDANSETRON HCL 4 MG PO TABS: 4.0000 mg | ORAL_TABLET | ORAL | Status: DC | PRN

## 2014-07-11 MED ORDER — METHYLERGONOVINE MALEATE 0.2 MG PO TABS: 0.2000 mg | ORAL_TABLET | ORAL | Status: DC | PRN

## 2014-07-11 MED ORDER — IBUPROFEN 800 MG PO TABS
800.0000 mg | ORAL_TABLET | Freq: Three times a day (TID) | ORAL | Status: DC
  Administered 2014-07-11 – 2014-07-12 (×4): 800 mg via ORAL
  Filled 2014-07-11 (×4): qty 1

## 2014-07-11 MED ORDER — OXYCODONE-ACETAMINOPHEN 5-325 MG PO TABS: 1.0000 | ORAL_TABLET | ORAL | Status: DC | PRN

## 2014-07-11 MED ORDER — LANOLIN HYDROUS EX OINT: TOPICAL_OINTMENT | CUTANEOUS | Status: DC | PRN

## 2014-07-11 MED ORDER — PRENATAL MULTIVITAMIN CH
1.0000 | ORAL_TABLET | Freq: Every day | ORAL | Status: DC
  Administered 2014-07-12: 1 via ORAL
  Filled 2014-07-11 (×2): qty 1

## 2014-07-11 MED ORDER — LACTATED RINGERS IV SOLN
INTRAVENOUS | Status: DC
  Administered 2014-07-11: 04:00:00 via INTRAVENOUS

## 2014-07-11 MED ORDER — OXYTOCIN 40 UNITS IN LACTATED RINGERS INFUSION - SIMPLE MED
62.5000 mL/h | INTRAVENOUS | Status: DC
  Filled 2014-07-11: qty 1000

## 2014-07-11 MED ORDER — SODIUM CHLORIDE 0.9 % IV SOLN: 250.0000 mL | INTRAVENOUS | Status: DC | PRN

## 2014-07-11 MED ORDER — DIBUCAINE 1 % RE OINT
1.0000 "application " | TOPICAL_OINTMENT | RECTAL | Status: DC | PRN
  Administered 2014-07-11: 1 via RECTAL
  Filled 2014-07-11: qty 28

## 2014-07-11 MED ORDER — OXYTOCIN BOLUS FROM INFUSION
500.0000 mL | INTRAVENOUS | Status: DC
Start: 2014-07-11 — End: 2014-07-11

## 2014-07-11 MED ORDER — MEASLES, MUMPS & RUBELLA VAC ~~LOC~~ INJ
0.5000 mL | INJECTION | Freq: Once | SUBCUTANEOUS | Status: DC
  Filled 2014-07-11: qty 0.5

## 2014-07-11 MED ORDER — LIDOCAINE HCL (PF) 1 % IJ SOLN
30.0000 mL | INTRAMUSCULAR | Status: DC | PRN
  Administered 2014-07-11: 30 mL via SUBCUTANEOUS
  Filled 2014-07-11: qty 30

## 2014-07-11 MED ORDER — OXYCODONE-ACETAMINOPHEN 5-325 MG PO TABS: 2.0000 | ORAL_TABLET | ORAL | Status: DC | PRN

## 2014-07-11 MED ORDER — SIMETHICONE 80 MG PO CHEW: 80.0000 mg | CHEWABLE_TABLET | ORAL | Status: DC | PRN

## 2014-07-11 MED ORDER — CITRIC ACID-SODIUM CITRATE 334-500 MG/5ML PO SOLN: 30.0000 mL | ORAL | Status: DC | PRN

## 2014-07-11 MED ORDER — TETANUS-DIPHTH-ACELL PERTUSSIS 5-2.5-18.5 LF-MCG/0.5 IM SUSP: 0.5000 mL | Freq: Once | INTRAMUSCULAR | Status: DC

## 2014-07-11 MED ORDER — METHYLERGONOVINE MALEATE 0.2 MG/ML IJ SOLN: 0.2000 mg | INTRAMUSCULAR | Status: DC | PRN

## 2014-07-11 MED ORDER — SENNOSIDES-DOCUSATE SODIUM 8.6-50 MG PO TABS
2.0000 | ORAL_TABLET | ORAL | Status: DC
  Administered 2014-07-12: 2 via ORAL
  Filled 2014-07-11: qty 2

## 2014-07-11 MED ORDER — CYCLOBENZAPRINE HCL 10 MG PO TABS
10.0000 mg | ORAL_TABLET | Freq: Every day | ORAL | Status: DC
  Filled 2014-07-11 (×3): qty 1

## 2014-07-11 MED ORDER — BUTORPHANOL TARTRATE 1 MG/ML IJ SOLN
1.0000 mg | INTRAMUSCULAR | Status: DC | PRN
  Administered 2014-07-11 (×2): 1 mg via INTRAVENOUS
  Filled 2014-07-11 (×2): qty 1

## 2014-07-11 MED ORDER — ONDANSETRON HCL 4 MG/2ML IJ SOLN: 4.0000 mg | Freq: Four times a day (QID) | INTRAMUSCULAR | Status: DC | PRN

## 2014-07-11 MED ORDER — BENZOCAINE-MENTHOL 20-0.5 % EX AERO
1.0000 "application " | INHALATION_SPRAY | CUTANEOUS | Status: DC | PRN
  Administered 2014-07-11: 1 via TOPICAL
  Filled 2014-07-11: qty 56

## 2014-07-11 MED ORDER — ONDANSETRON HCL 4 MG/2ML IJ SOLN: 4.0000 mg | INTRAMUSCULAR | Status: DC | PRN

## 2014-07-11 NOTE — Progress Notes (Signed)
UR chart review completed.  

## 2014-07-11 NOTE — H&P (Signed)
24 y.o. 7345w4d  [id number] comes in c/o labor.  Otherwise has good fetal movement and no bleeding.  Past Medical History  Diagnosis Date  . Headache(784.0)     freq during allergy season  . Hemorrhoids   . Anemia     Past Surgical History  Procedure Laterality Date  . Induced abortion      OB History  Gravida Para Term Preterm AB SAB TAB Ectopic Multiple Living  6 2 2  0 3 1   0 2    # Outcome Date GA Lbr Len/2nd Weight Sex Delivery Anes PTL Lv  6 Current           5 AB 2013          4 AB 2013    U  None N N     Comments: 8 weeks  3 Term 10/20/10 5810w5d 52:45 / 00:24 2.99 kg (6 lb 9.5 oz) F Vag-Spont EPI  Y     Comments: wnl  2 Term 2010   3.221 kg (7 lb 1.6 oz) M Vag-Spont EPI N Y  1 SAB 2009              History   Social History  . Marital Status: Single    Spouse Name: N/A  . Number of Children: N/A  . Years of Education: N/A   Occupational History  . Not on file.   Social History Main Topics  . Smoking status: Never Smoker   . Smokeless tobacco: Never Used  . Alcohol Use: No  . Drug Use: No  . Sexual Activity: Not Currently    Birth Control/ Protection: None   Other Topics Concern  . Not on file   Social History Narrative   Review of patient's allergies indicates no known allergies.    Prenatal Transfer Tool  Maternal Diabetes: No Genetic Screening: Normal Maternal Ultrasounds/Referrals: Normal Fetal Ultrasounds or other Referrals:  None Maternal Substance Abuse:  No Significant Maternal Medications:  None Significant Maternal Lab Results: None  Other PNC: uncomplicated.    Filed Vitals:   07/11/14 0400  BP: 131/54  Pulse: 81  Temp: 98.3 F (36.8 C)  Resp: 16     Lungs/Cor:  NAD Abdomen:  soft, gravid Ex:  no cords, erythema SVE:  7.5/C/-2 FHTs:  140, good STV, NST R Toco:  q 3-5   A/P   Term labor.    GBS neg.  Azuri Bozard A

## 2014-07-11 NOTE — Lactation Note (Signed)
This note was copied from the chart of Abigail Phelps. Lactation Consultation Note  Initial visit done.  Breastfeeding consultation services and support information given and reviewed.  Mom is concerned baby has been cluster feeding the past few hours.  Baby is currently sleeping.  Reassured and encouraged to use good waking techniques and breast massage during feeding.  Mom feels baby is latching deep.  Instructed to call for concerns/assist prn.  Patient Name: Abigail Phelps [id number]'s Date: 07/11/2014 Reason for consult: Initial assessment   Maternal Data    Feeding Feeding Type: Breast Fed  LATCH Score/Interventions                      Lactation Tools Discussed/Used     Consult Status Consult Status: Follow-up Date: 07/12/14 Follow-up type: In-patient    Huston FoleyMOULDEN, Avon Molock S 07/11/2014, 10:03 PM

## 2014-07-11 NOTE — MAU Note (Signed)
PT  SAYS  HURT BAD SINCE  9PM.. VE ON Monday  3 CM.  DENIES HSV AND MRSA.  GBS-  NEG

## 2014-07-12 ENCOUNTER — Inpatient Hospital Stay (HOSPITAL_COMMUNITY): Admission: RE | Admit: 2014-07-12 | Payer: Medicaid Other | Source: Ambulatory Visit

## 2014-07-12 LAB — CBC
HCT: 25.6 % — ABNORMAL LOW (ref 36.0–46.0)
HEMOGLOBIN: 8.3 g/dL — AB (ref 12.0–15.0)
MCH: 24.4 pg — ABNORMAL LOW (ref 26.0–34.0)
MCHC: 32.4 g/dL (ref 30.0–36.0)
MCV: 75.3 fL — ABNORMAL LOW (ref 78.0–100.0)
PLATELETS: 198 10*3/uL (ref 150–400)
RBC: 3.4 MIL/uL — AB (ref 3.87–5.11)
RDW: 14.6 % (ref 11.5–15.5)
WBC: 11.2 10*3/uL — AB (ref 4.0–10.5)

## 2014-07-12 MED ORDER — OXYCODONE-ACETAMINOPHEN 5-325 MG PO TABS: 2.0000 | ORAL_TABLET | ORAL | Status: AC | PRN

## 2014-07-12 MED ORDER — IBUPROFEN 600 MG PO TABS: 600.0000 mg | ORAL_TABLET | Freq: Four times a day (QID) | ORAL | Status: AC | PRN

## 2014-07-12 MED ORDER — DOCUSATE SODIUM 100 MG PO CAPS: 100.0000 mg | ORAL_CAPSULE | Freq: Two times a day (BID) | ORAL | Status: AC

## 2014-07-12 NOTE — Discharge Summary (Signed)
Obstetric Discharge Summary Reason for Admission: onset of labor Prenatal Procedures: NST and ultrasound Intrapartum Procedures: spontaneous vaginal delivery Postpartum Procedures: none Complications-Operative and Postpartum: none HEMOGLOBIN  Date Value Ref Range Status  07/12/2014 8.3* 12.0 - 15.0 g/dL Final    Comment:    REPEATED TO VERIFY DELTA CHECK NOTED    HCT  Date Value Ref Range Status  07/12/2014 25.6* 36.0 - 46.0 % Final    Physical Exam:  General: alert, cooperative and appears stated age 72Lochia: appropriate Uterine Fundus: firm  Discharge Diagnoses: Term Pregnancy-delivered  Discharge Information: Date: 07/12/2014 Activity: pelvic rest Diet: routine Medications: Ibuprofen, Colace and Percocet Condition: improved Instructions: refer to practice specific booklet Discharge to: home Follow-up Information    Follow up with HORVATH,MICHELLE A, MD In 4 weeks.   Specialty:  Obstetrics and Gynecology   Why:  For a postpartum evaluation   Contact information:   [address] [place] [address] [phone]       Newborn Data: Live born female  Birth Weight: 6 lb 15.1 oz (3150 g) APGAR: 8, 9  Home with mother.  Lymon Kidney H. 07/12/2014, 10:55 AM

## 2014-07-12 NOTE — Lactation Note (Signed)
This note was copied from the chart of Abigail Phelps. Lactation Consultation Note; Experienced BF mom has baby latched to the breast when I went into room. Mom reports some pain just at the beginning of the feeding then eases off. Has comfort gels- reviewed instructions with mom. Reports breasts are feeling fuller today, Reviewed engorgement prevention and treatment Manual pump given for home. States she has used it before. #27 flange given. No questions at present. To call prn.  Patient Name: Abigail Phelps [id number]'s Date: 07/12/2014 Reason for consult: Follow-up assessment   Maternal Data Formula Feeding for Exclusion: No Does the patient have breastfeeding experience prior to this delivery?: Yes  Feeding Feeding Type: Breast Fed  LATCH Score/Interventions Latch: Grasps breast easily, tongue down, lips flanged, rhythmical sucking.  Audible Swallowing: A few with stimulation  Type of Nipple: Everted at rest and after stimulation  Comfort (Breast/Nipple): Filling, red/small blisters or bruises, mild/mod discomfort  Problem noted: Mild/Moderate discomfort Interventions (Mild/moderate discomfort): Comfort gels;Hand expression  Hold (Positioning): No assistance needed to correctly position infant at breast.  LATCH Score: 8  Lactation Tools Discussed/Used Pump Review: Setup, frequency, and cleaning Initiated by:: Dw Date initiated:: 07/12/14   Consult Status Consult Status: Complete    Pamelia HoitWeeks, Chisom Aust D 07/12/2014, 1:52 PM
